# Patient Record
Sex: Female | Born: 1975
Health system: Southern US, Community
[De-identification: ages and names within clinical notes are randomized; demographics above are authoritative.]

## PROBLEM LIST (undated history)

## (undated) HISTORY — PX: TUBAL LIGATION: SHX77

---

## 1998-06-28 ENCOUNTER — Emergency Department (HOSPITAL_COMMUNITY): Admission: EM | Admit: 1998-06-28 | Discharge: 1998-06-28 | Payer: Self-pay | Admitting: Emergency Medicine

## 1998-06-28 ENCOUNTER — Encounter: Payer: Self-pay | Admitting: *Deleted

## 1999-11-05 ENCOUNTER — Other Ambulatory Visit: Admission: RE | Admit: 1999-11-05 | Discharge: 1999-11-05 | Payer: Self-pay | Admitting: *Deleted

## 2000-10-28 ENCOUNTER — Other Ambulatory Visit: Admission: RE | Admit: 2000-10-28 | Discharge: 2000-10-28 | Payer: Self-pay | Admitting: Internal Medicine

## 2001-12-08 ENCOUNTER — Other Ambulatory Visit: Admission: RE | Admit: 2001-12-08 | Discharge: 2001-12-08 | Payer: Self-pay | Admitting: Family Medicine

## 2002-12-21 ENCOUNTER — Other Ambulatory Visit: Admission: RE | Admit: 2002-12-21 | Discharge: 2002-12-21 | Payer: Self-pay | Admitting: Family Medicine

## 2013-01-15 ENCOUNTER — Ambulatory Visit (INDEPENDENT_AMBULATORY_CARE_PROVIDER_SITE_OTHER): Payer: BC Managed Care – PPO | Admitting: Emergency Medicine

## 2013-01-15 VITALS — BP 112/74 | HR 60 | Temp 98.9°F | Resp 16 | Ht 63.0 in | Wt 130.8 lb

## 2013-01-15 DIAGNOSIS — L500 Allergic urticaria: Secondary | ICD-10-CM

## 2013-01-15 MED ORDER — CYPROHEPTADINE HCL 4 MG PO TABS: 4.0000 mg | ORAL_TABLET | Freq: Four times a day (QID) | ORAL | Status: DC | PRN

## 2013-01-15 MED ORDER — OMEPRAZOLE 40 MG PO CPDR: 40.0000 mg | DELAYED_RELEASE_CAPSULE | Freq: Every day | ORAL | Status: DC

## 2013-01-15 MED ORDER — DIPHENHYDRAMINE HCL 25 MG PO CAPS: 25.0000 mg | ORAL_CAPSULE | Freq: Four times a day (QID) | ORAL | Status: DC | PRN

## 2013-01-15 MED ORDER — METHYLPREDNISOLONE ACETATE 80 MG/ML IJ SUSP
120.0000 mg | Freq: Once | INTRAMUSCULAR | Status: AC
  Administered 2013-01-15: 120 mg via INTRAMUSCULAR

## 2013-01-15 NOTE — Patient Instructions (Addendum)

## 2013-01-15 NOTE — Progress Notes (Signed)
[facility] [address] [place] [address] [phone]  Date:  01/15/2013   Name:  Kathy Rose   DOB:  24-Feb-1976   MRN:  [id number]  PCP:  No primary provider on file.    Chief Complaint: Rash   History of Present Illness:  Kathy Rose is a 37 y.o. very pleasant female patient who presents with the following:  No history of new allergen exposure or antecedent illness.  No history of new personal hygiene products.  Has generalized hives started Thursday.  Thought was a "bug bite" and the rash has spread.  No pulmonary symptoms.  No wheezing or cough or shortness of breath.  No nausea or vomiting.  No improvement with over the counter medications or other home remedies. Denies other complaint or health concern today.   There are no active problems to display for this patient.   History reviewed. No pertinent past medical history.  History reviewed. No pertinent past surgical history.  History  Substance Use Topics  . Smoking status: Never Smoker   . Smokeless tobacco: Not on file  . Alcohol Use: Not on file    History reviewed. No pertinent family history.  No Known Allergies  Medication list has been reviewed and updated.  No current outpatient prescriptions on file prior to visit.   No current facility-administered medications on file prior to visit.    Review of Systems:  As per HPI, otherwise negative.    Physical Examination: Filed Vitals:   01/15/13 1836  BP: 112/74  Pulse: 60  Temp: 98.9 F (37.2 C)  Resp: 16   Filed Vitals:   01/15/13 1836  Height: 5\' 3"  (1.6 m)  Weight: 130 lb 12.8 oz (59.33 kg)   Body mass index is 23.18 kg/(m^2). Ideal Body Weight: Weight in (lb) to have BMI = 25: 140.8  GEN: WDWN, NAD, Non-toxic, A & O x 3 HEENT: Atraumatic, Normocephalic. Neck supple. No masses, No LAD. Ears and Nose: No external deformity. CV: RRR, No M/G/R. No JVD. No thrill. No extra heart sounds. PULM: CTA B, no  wheezes, crackles, rhonchi. No retractions. No resp. distress. No accessory muscle use. ABD: S, NT, ND, +BS. No rebound. No HSM. EXTR: No c/c/e NEURO Normal gait.  PSYCH: Normally interactive. Conversant. Not depressed or anxious appearing.  Calm demeanor.  SKIN:  Hives on arms and neck and chest  Assessment and Plan: Hives Benadryl Periactin Depo medrol 120 prilosec   Signed,  Phillips Odor, MD

## 2013-01-29 ENCOUNTER — Telehealth: Payer: Self-pay

## 2013-01-29 DIAGNOSIS — L500 Allergic urticaria: Secondary | ICD-10-CM

## 2013-01-29 NOTE — Telephone Encounter (Signed)
Not until we see that she doesn't improve in a week.  Have her check back in a week.

## 2013-01-29 NOTE — Telephone Encounter (Signed)
Would you like to refer her to Dermatology?

## 2013-01-29 NOTE — Telephone Encounter (Signed)
Pt is wanting to talk with somone about getting a referral to a dermatologist   Best number [phone]

## 2013-02-04 NOTE — Telephone Encounter (Signed)
Left message to return call. Spoke to Dr. Dareen Piano to clarify and since she was seen 01/15/13 we can put in referral. Need to let patient know referral is placed

## 2013-06-15 ENCOUNTER — Ambulatory Visit (INDEPENDENT_AMBULATORY_CARE_PROVIDER_SITE_OTHER): Payer: BC Managed Care – PPO | Admitting: Family Medicine

## 2013-06-15 VITALS — BP 100/60 | HR 67 | Temp 98.0°F | Resp 18 | Ht 64.0 in | Wt 134.0 lb

## 2013-06-15 DIAGNOSIS — J208 Acute bronchitis due to other specified organisms: Secondary | ICD-10-CM

## 2013-06-15 DIAGNOSIS — J069 Acute upper respiratory infection, unspecified: Secondary | ICD-10-CM

## 2013-06-15 DIAGNOSIS — J209 Acute bronchitis, unspecified: Secondary | ICD-10-CM

## 2013-06-15 MED ORDER — ALBUTEROL SULFATE HFA 108 (90 BASE) MCG/ACT IN AERS: 2.0000 | INHALATION_SPRAY | RESPIRATORY_TRACT | Status: DC | PRN

## 2013-06-15 MED ORDER — BENZONATATE 100 MG PO CAPS: 100.0000 mg | ORAL_CAPSULE | Freq: Three times a day (TID) | ORAL | Status: DC | PRN

## 2013-06-15 NOTE — Progress Notes (Signed)
Subjective: Patient has been sick since Monday with a respiratory tract infection. She's been coughing. Last night she had some chills. She has not documented any fever. She's not coughing up a lot or blowing much from her head. She does not smoke. She has continued to work all week, though probably annoying her coworkers with a persistent cough. She does get a moderate number of respiratory tract infections. Her spouse and child have been sick. She has 2 children 7 and 1. 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  Objective: Pleasant alert lady with a fairly persistent cough. She looks like she doesn't feel well. Her TMs are normal. Throat has mild erythema down the midline where it looks like she's had postnasal drainage, but tonsillar tissue looks small and not inflamed. Her neck was supple without significant nodes. Chest is clear to auscultation but deep expiration triggers a lot of coughing.  Assessment: URI and bronchitis, viral, with some bronchospasm  Plan:  Albuterol inhaler Tessalon If the cough gets to where it's bothering her a lot at night we can get her a prescription for Hycodan, but I will not give that at this time since she is resting okay.

## 2013-06-15 NOTE — Patient Instructions (Signed)
Medication as ordered  Drink plenty of fluids  Get sufficient rest  Return or contact me if you begin bringing up a lot of purulent phlegm or if you start running a significant fever or get short or breath

## 2013-06-18 ENCOUNTER — Telehealth: Payer: Self-pay

## 2013-06-18 NOTE — Telephone Encounter (Addendum)
She used it for 2 doses. She states it made her tongue feel like it felt when she had a peanut allergy. I am concerned about her trying this again. Called her, to advise not to use this medication, and there is no alternative. Left message for her to call me back.

## 2013-06-18 NOTE — Telephone Encounter (Signed)
Pt is calling again in regards to getting something in place of albuteral due to her rxn to the medication. Best# [phone]

## 2013-06-18 NOTE — Telephone Encounter (Signed)
PT STATES THE ALBUTEROL SHE GIVEN ISN'T AGREEING WITH HER NEED TO HAVE SOMETHING ELSE CALLED IN. PLEASE CALL PT AT [phone]    [facility]

## 2013-06-18 NOTE — Telephone Encounter (Signed)
Unfortunately there is not an alternative to albuterol.  Is she having symptoms of an allergy like swelling of tongue/lips/throat, difficulty breathing, hives, etc?  If so she should discontinue right away.    Is the albuterol helping her respiratory symptoms?  If so (and symptoms are not concerning for hypersensitivity), getting a spacer from the pharmacy may be beneficial as this helps with the timing of inhalation.  Possible that technique may have contributed to medication being deposited into her mouth rather than inhaled in the lungs, causing irritation.  If the medication is not providing significant relief of her symptoms, ok to discontinue.

## 2013-06-18 NOTE — Telephone Encounter (Signed)
Patient states the medication made her tongue itch, she states she is better now. She currently has no problems, she is asking if there is an alternative to the albuterol.

## 2013-06-18 NOTE — Telephone Encounter (Signed)
lmom to cb. 

## 2013-06-19 NOTE — Telephone Encounter (Signed)
Left message for her to call me back so I can advise.  

## 2013-06-21 ENCOUNTER — Ambulatory Visit (INDEPENDENT_AMBULATORY_CARE_PROVIDER_SITE_OTHER): Payer: BC Managed Care – PPO | Admitting: Family Medicine

## 2013-06-21 ENCOUNTER — Telehealth: Payer: Self-pay

## 2013-06-21 VITALS — BP 110/68 | HR 74 | Temp 97.9°F | Resp 16 | Ht 64.0 in | Wt 134.4 lb

## 2013-06-21 DIAGNOSIS — J9801 Acute bronchospasm: Secondary | ICD-10-CM

## 2013-06-21 DIAGNOSIS — J209 Acute bronchitis, unspecified: Secondary | ICD-10-CM

## 2013-06-21 MED ORDER — PREDNISONE 20 MG PO TABS: ORAL_TABLET | ORAL | Status: DC

## 2013-06-21 MED ORDER — HYDROCOD POLST-CHLORPHEN POLST 10-8 MG/5ML PO LQCR: 5.0000 mL | Freq: Two times a day (BID) | ORAL | Status: DC | PRN

## 2013-06-21 MED ORDER — AZITHROMYCIN 250 MG PO TABS: ORAL_TABLET | ORAL | Status: DC

## 2013-06-21 NOTE — Telephone Encounter (Signed)
Please advise, she could not use the Albuterol, she is still coughing would like something else besides the tessalon

## 2013-06-21 NOTE — Progress Notes (Addendum)
Subjective:    Patient ID: Kathy Rose, female    DOB: February 05, 1976, 37 y.o.   MRN: [id number]  This chart was scribed for Kathy Chick, MD by Blanchard Kelch, ED Scribe. The patient was seen in room 12. Patient's care was started at 6:13 PM.   Cough Associated symptoms include postnasal drip, rhinorrhea, a sore throat and wheezing. Pertinent negatives include no chills, ear pain, fever or shortness of breath.    Kathy Rose is a 37 y.o. female who presents to office for a recheck from a week ago for a cough. She was seen by Dr. Alwyn Ren, who prescribed [facility] and an Albuterol inhaler. The cough began two days before being seen here.   She states that she was gradually improving, but when she goes to sleep at night she is waking her children up. She has not gotten a full night of sleep in the past week. Last week she was able to sleep through the night, so this is a new development. The cough seems to subside mildly during the day but always flares up at night. She has noticed mild wheezing at night as well. She had chills only once, a week ago. She has had a mild sore throat that she attributes to the coughing. She also complains of rhinorrhea with yellowish discharge and postnasal drip at night when she is laying down.  She tried eating raw onion for the cough, as well as honey and cinnamon and green tea as home remedies. She also tried the prescribed Tessalon Perles without relief. She believes she may be allergic to the albuterol inhaler because it caused her mouth to feel strange after using it once, so she stopped using it. She denies fever, ear pain, congestion, shortness of breath or sneezing.  She has daughters and states that one of them had similar symptoms that subsided quickly. She denies a history of asthma or seasonal allergies. She is not a smoker. She works at [employer] for [employer].   History reviewed. No pertinent past medical history. History reviewed. No pertinent  past surgical history. History reviewed. No pertinent family history. History   Social History  . Marital Status: Unknown    Spouse Name: N/A    Number of Children: N/A  . Years of Education: N/A   Occupational History  . Not on file.   Social History Main Topics  . Smoking status: Never Smoker   . Smokeless tobacco: Not on file  . Alcohol Use: Not on file  . Drug Use: Not on file  . Sexual Activity: Not on file   Other Topics Concern  . Not on file   Social History Narrative  . No narrative on file   Allergies  Allergen Reactions  . Albuterol     Patient indicates this made her tongue feel "funny"  . Claritin [Loratadine]    Current Outpatient Prescriptions on File Prior to Visit  Medication Sig Dispense Refill  . benzonatate (TESSALON) 100 MG capsule Take 1-2 capsules (100-200 mg total) by mouth 3 (three) times daily as needed for cough.  30 capsule  0  . cyproheptadine (PERIACTIN) 4 MG tablet Take 1 tablet (4 mg total) by mouth 4 (four) times daily as needed.  40 tablet  0  . omeprazole (PRILOSEC) 40 MG capsule Take 1 capsule (40 mg total) by mouth daily.  30 capsule  3   No current facility-administered medications on file prior to visit.     Review of Systems  Constitutional:  Negative for fever and chills.  HENT: Positive for postnasal drip, rhinorrhea and sore throat. Negative for congestion, ear pain and sneezing.   Respiratory: Positive for cough and wheezing. Negative for shortness of breath.        Objective:   Physical Exam  Nursing note and vitals reviewed. Constitutional: She is oriented to person, place, and time. She appears well-developed and well-nourished. No distress.  HENT:  Head: Normocephalic and atraumatic.  Right Ear: Tympanic membrane, external ear and ear canal normal.  Left Ear: Tympanic membrane, external ear and ear canal normal.  Mouth/Throat: Oropharynx is clear and moist and mucous membranes are normal.  Eyes: EOM are normal.   Neck: Normal range of motion. Neck supple. No tracheal deviation present.  Cardiovascular: Normal rate and regular rhythm.   Pulmonary/Chest: Effort normal. No respiratory distress. She has wheezes.  Scant expiratory wheezing in the right lung field  Musculoskeletal: Normal range of motion.  Lymphadenopathy:    She has no cervical adenopathy.  Neurological: She is alert and oriented to person, place, and time.  Skin: Skin is warm and dry.  Psychiatric: She has a normal mood and affect. Her behavior is normal.          Assessment & Plan:  Acute bronchitis  Bronchospasm   Meds ordered this encounter  Medications  . chlorpheniramine-HYDROcodone (TUSSIONEX) 10-8 MG/5ML LQCR    Sig: Take 5 mLs by mouth every 12 (twelve) hours as needed for cough.    Dispense:  240 mL    Refill:  0  . azithromycin (ZITHROMAX) 250 MG tablet    Sig: Take 2 tabs PO x 1 dose, then 1 tab PO QD x 4 days    Dispense:  6 tablet    Refill:  0  . predniSONE (DELTASONE) 20 MG tablet    Sig: Two tablets daily x 5 days then one tablet daily x 5 days    Dispense:  15 tablet    Refill:  0    I personally performed the services described in this documentation, which was scribed in my presence.  The recorded information has been reviewed and is accurate.   Nilda Simmer, M.D.  [facility] [address] [place], [place]  [address] [phone] phone [phone] fax

## 2013-06-21 NOTE — Telephone Encounter (Signed)
Noted.  I see that Dr. Katrinka Blazing saw her.

## 2013-06-21 NOTE — Patient Instructions (Signed)

## 2013-06-21 NOTE — Telephone Encounter (Signed)
She finally called back, she is requesting something else for cough, message sent to Dr Alwyn Ren, she knows not to use the albuterol, there is no alternative.

## 2013-06-21 NOTE — Telephone Encounter (Signed)
Pt is calling because she is needing something stronger than the tesalon pills for the cough Call back number is [phone]

## 2013-08-03 DIAGNOSIS — N920 Excessive and frequent menstruation with regular cycle: Secondary | ICD-10-CM | POA: Insufficient documentation

## 2014-08-14 ENCOUNTER — Other Ambulatory Visit: Payer: Self-pay | Admitting: Family Medicine

## 2014-08-14 ENCOUNTER — Other Ambulatory Visit (HOSPITAL_COMMUNITY)
Admission: RE | Admit: 2014-08-14 | Discharge: 2014-08-14 | Disposition: A | Discharge status: Home / Self Care | Payer: BC Managed Care – PPO | Source: Ambulatory Visit | Attending: Family Medicine | Admitting: Family Medicine

## 2014-08-14 DIAGNOSIS — Z124 Encounter for screening for malignant neoplasm of cervix: Secondary | ICD-10-CM | POA: Diagnosis not present

## 2014-08-19 LAB — CYTOLOGY - PAP

## 2016-05-07 ENCOUNTER — Encounter (HOSPITAL_COMMUNITY): Payer: Self-pay | Admitting: *Deleted

## 2016-05-07 ENCOUNTER — Ambulatory Visit (HOSPITAL_COMMUNITY)
Admission: EM | Admit: 2016-05-07 | Discharge: 2016-05-07 | Disposition: A | Discharge status: Home / Self Care | Payer: BC Managed Care – PPO | Attending: Internal Medicine | Admitting: Internal Medicine

## 2016-05-07 DIAGNOSIS — M62830 Muscle spasm of back: Secondary | ICD-10-CM

## 2016-05-07 DIAGNOSIS — S39012A Strain of muscle, fascia and tendon of lower back, initial encounter: Secondary | ICD-10-CM

## 2016-05-07 MED ORDER — CYCLOBENZAPRINE HCL 10 MG PO TABS: ORAL_TABLET | ORAL | 0 refills | Status: DC

## 2016-05-07 MED ORDER — MELOXICAM 7.5 MG PO TABS: ORAL_TABLET | ORAL | 1 refills | Status: DC

## 2016-05-07 MED FILL — MELOXICAM 7.5 MG TABLET: 7.5 | 30 days supply | Qty: 30 | Fill #0

## 2016-05-07 MED FILL — CYCLOBENZAPRINE 10 MG TAB: 10 | 7 days supply | Qty: 20 | Fill #0
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
## 2016-07-20 DIAGNOSIS — N921 Excessive and frequent menstruation with irregular cycle: Secondary | ICD-10-CM | POA: Diagnosis not present

## 2016-07-20 DIAGNOSIS — Z0000 Encounter for general adult medical examination without abnormal findings: Secondary | ICD-10-CM | POA: Diagnosis not present

## 2016-07-20 DIAGNOSIS — M7742 Metatarsalgia, left foot: Secondary | ICD-10-CM | POA: Diagnosis not present

## 2016-07-20 DIAGNOSIS — L509 Urticaria, unspecified: Secondary | ICD-10-CM | POA: Diagnosis not present

## 2016-07-21 ENCOUNTER — Other Ambulatory Visit: Payer: Self-pay | Admitting: Family Medicine

## 2016-07-21 DIAGNOSIS — N921 Excessive and frequent menstruation with irregular cycle: Secondary | ICD-10-CM

## 2016-08-03 ENCOUNTER — Other Ambulatory Visit: Payer: BC Managed Care – PPO

## 2016-08-24 ENCOUNTER — Ambulatory Visit
Admission: RE | Admit: 2016-08-24 | Discharge: 2016-08-24 | Disposition: A | Discharge status: Home / Self Care | Payer: 59 | Source: Ambulatory Visit | Attending: Family Medicine | Admitting: Family Medicine

## 2016-08-24 DIAGNOSIS — N921 Excessive and frequent menstruation with irregular cycle: Secondary | ICD-10-CM

## 2016-09-21 ENCOUNTER — Ambulatory Visit (HOSPITAL_COMMUNITY)
Admission: EM | Admit: 2016-09-21 | Discharge: 2016-09-21 | Disposition: A | Discharge status: Home / Self Care | Payer: 59 | Attending: Family Medicine | Admitting: Family Medicine

## 2016-09-21 DIAGNOSIS — R05 Cough: Secondary | ICD-10-CM

## 2016-09-21 DIAGNOSIS — J40 Bronchitis, not specified as acute or chronic: Secondary | ICD-10-CM

## 2016-09-21 DIAGNOSIS — J00 Acute nasopharyngitis [common cold]: Secondary | ICD-10-CM | POA: Diagnosis not present

## 2016-09-21 DIAGNOSIS — R059 Cough, unspecified: Secondary | ICD-10-CM

## 2016-09-21 MED ORDER — AZITHROMYCIN 250 MG PO TABS: 250.0000 mg | ORAL_TABLET | Freq: Every day | ORAL | 0 refills | Status: DC

## 2016-09-21 MED ORDER — IPRATROPIUM BROMIDE 0.06 % NA SOLN: 2.0000 | Freq: Four times a day (QID) | NASAL | 0 refills | Status: DC

## 2016-09-21 MED ORDER — BENZONATATE 100 MG PO CAPS: 100.0000 mg | ORAL_CAPSULE | Freq: Three times a day (TID) | ORAL | 0 refills | Status: DC

## 2016-09-21 NOTE — ED Triage Notes (Signed)
C/o cough and congestion for two weeks States she was feeling like this in the past and it went away  States cough is not productive  otc meds used as tx  No distress at this time

## 2016-09-21 NOTE — ED Provider Notes (Signed)
CSN: [id number]     Arrival date & time 09/21/16  1007 History   First MD Initiated Contact with Patient 09/21/16 1057     Chief Complaint  Patient presents with  . Cough  . Nasal Congestion   (Consider location/radiation/quality/duration/timing/severity/associated sxs/prior Treatment) Patient c/o cough congestion and uri sx's for over 2 weeks.   The history is provided by the patient.  Cough  Cough characteristics:  Productive Sputum characteristics:  White Severity:  Moderate Onset quality:  Sudden Duration:  2 weeks Timing:  Constant Progression:  Unchanged Chronicity:  New Smoker: no   Context: upper respiratory infection   Relieved by:  Nothing Worsened by:  Nothing Ineffective treatments:  None tried Associated symptoms: rhinorrhea     No past medical history on file. No past surgical history on file. No family history on file. Social History  Substance Use Topics  . Smoking status: Never Smoker  . Smokeless tobacco: Never Used  . Alcohol use No   OB History    No data available     Review of Systems  Constitutional: Negative.   HENT: Positive for rhinorrhea.   Eyes: Negative.   Respiratory: Positive for cough.   Cardiovascular: Negative.   Gastrointestinal: Negative.   Endocrine: Negative.   Genitourinary: Negative.   Musculoskeletal: Negative.   Skin: Negative.   Allergic/Immunologic: Negative.   Hematological: Negative.   Psychiatric/Behavioral: Negative.     Allergies  Albuterol and Claritin [loratadine]  Home Medications   Prior to Admission medications   Medication Sig Start Date End Date Taking? Authorizing Provider  azithromycin (ZITHROMAX) 250 MG tablet Take 2 tabs PO x 1 dose, then 1 tab PO QD x 4 days 06/21/13   Ethelda Chick, MD  azithromycin (ZITHROMAX) 250 MG tablet Take 1 tablet (250 mg total) by mouth daily. Take first 2 tablets together, then 1 every day until finished. 09/21/16   Deatra Canter, FNP  benzonatate (TESSALON) 100  MG capsule Take 1-2 capsules (100-200 mg total) by mouth 3 (three) times daily as needed for cough. 06/15/13   Peyton Najjar, MD  benzonatate (TESSALON) 100 MG capsule Take 1 capsule (100 mg total) by mouth every 8 (eight) hours. 09/21/16   Deatra Canter, FNP  chlorpheniramine-HYDROcodone (TUSSIONEX) 10-8 MG/5ML LQCR Take 5 mLs by mouth every 12 (twelve) hours as needed for cough. 06/21/13   Ethelda Chick, MD  cyclobenzaprine (FLEXERIL) 10 MG tablet 1/2 to 1 tablet every 8 hours as needed for muscle spasm 05/07/16   Riki Sheer, PA-C  cyproheptadine (PERIACTIN) 4 MG tablet Take 1 tablet (4 mg total) by mouth 4 (four) times daily as needed. 01/15/13   Carmelina Dane, MD  ipratropium (ATROVENT) 0.06 % nasal spray Place 2 sprays into both nostrils 4 (four) times daily. 09/21/16   Deatra Canter, FNP  meloxicam (MOBIC) 7.5 MG tablet 1 or 2 a day for back pain and inflammation. 05/07/16   Riki Sheer, PA-C  omeprazole (PRILOSEC) 40 MG capsule Take 1 capsule (40 mg total) by mouth daily. 01/15/13   Carmelina Dane, MD  predniSONE (DELTASONE) 20 MG tablet Two tablets daily x 5 days then one tablet daily x 5 days 06/21/13   Ethelda Chick, MD   Meds Ordered and Administered this Visit  Medications - No data to display  BP 113/69 (BP Location: Left Arm)   Pulse 79   Temp 99 F (37.2 C) (Oral)   Resp 16   SpO2  95%  No data found.   Physical Exam  Constitutional: She appears well-developed and well-nourished.  HENT:  Head: Normocephalic and atraumatic.  Right Ear: External ear normal.  Left Ear: External ear normal.  Mouth/Throat: Oropharynx is clear and moist.  Eyes: Conjunctivae and EOM are normal. Pupils are equal, round, and reactive to light.  Neck: Normal range of motion. Neck supple.  Cardiovascular: Normal rate, regular rhythm and normal heart sounds.   Pulmonary/Chest: Effort normal and breath sounds normal.  Abdominal: Soft. Bowel sounds are normal.  Nursing note and  vitals reviewed.   Urgent Care Course     Procedures (including critical care time)  Labs Review Labs Reviewed - No data to display  Imaging Review No results found.   Visual Acuity Review  Right Eye Distance:   Left Eye Distance:   Bilateral Distance:    Right Eye Near:   Left Eye Near:    Bilateral Near:         MDM   1. Acute nasopharyngitis   2. Bronchitis   3. Cough    zpak Tessalon Ipratropium nasal spray   Push po fluids, rest, tylenol and motrin otc prn as directed for fever, arthralgias, and myalgias.  Follow up prn if sx's continue or persist.    Deatra CanterWilliam J Oxford, FNP 09/21/16 1106

## 2016-10-04 ENCOUNTER — Other Ambulatory Visit: Payer: Self-pay | Admitting: Family Medicine

## 2016-10-04 DIAGNOSIS — N83201 Unspecified ovarian cyst, right side: Secondary | ICD-10-CM

## 2016-10-07 ENCOUNTER — Ambulatory Visit
Admission: RE | Admit: 2016-10-07 | Discharge: 2016-10-07 | Disposition: A | Discharge status: Home / Self Care | Payer: 59 | Source: Ambulatory Visit | Attending: Family Medicine | Admitting: Family Medicine

## 2016-10-07 DIAGNOSIS — N83201 Unspecified ovarian cyst, right side: Secondary | ICD-10-CM

## 2017-02-16 IMAGING — US US TRANSVAGINAL NON-OB
1 series · 13 of 25 positions shown · non-contrast
Comparison: No recent.

CLINICAL DATA: Intermittent spotting.

EXAM:
TRANSABDOMINAL AND TRANSVAGINAL ULTRASOUND OF PELVIS
TECHNIQUE: Both transabdominal and transvaginal ultrasound examinations of the
pelvis were performed. Transabdominal technique was performed for
global imaging of the pelvis including uterus, ovaries, adnexal
regions, and pelvic cul-de-sac. It was necessary to proceed with
endovaginal exam following the transabdominal exam to visualize the
uterus and ovaries.

[Series 1: us transvaginal non-ob · 0.22mm/px · 13 of 71 slices shown]
[im 1/71]
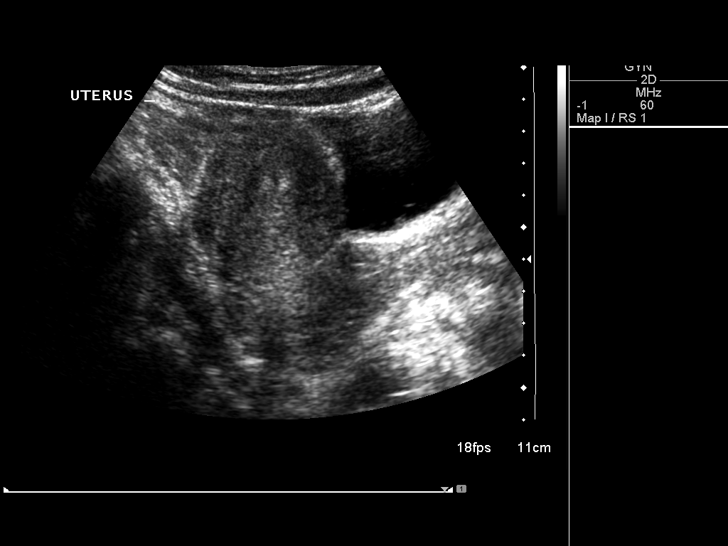
[im 6/71]
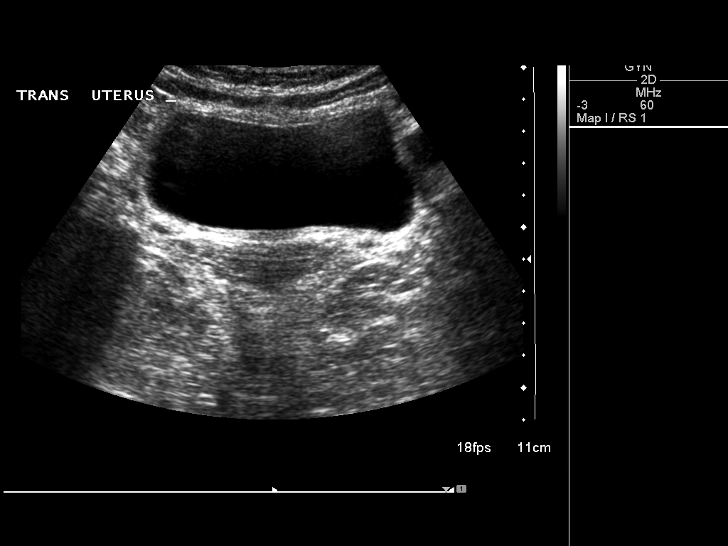
[im 12/71]
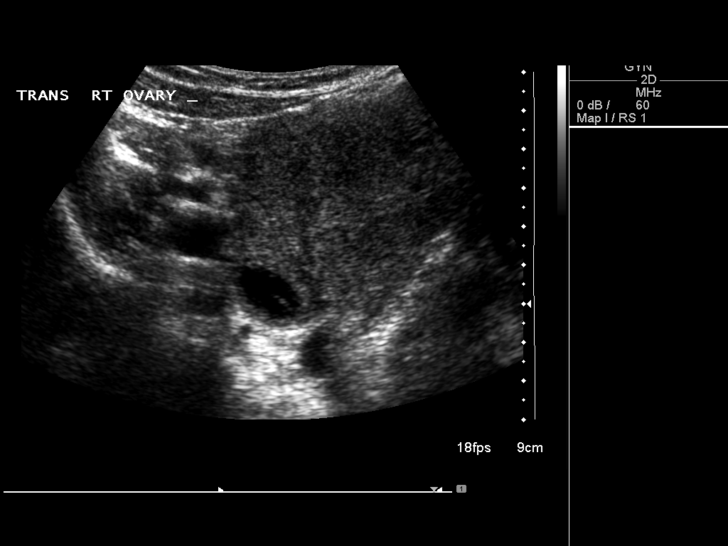
[im 18/71]
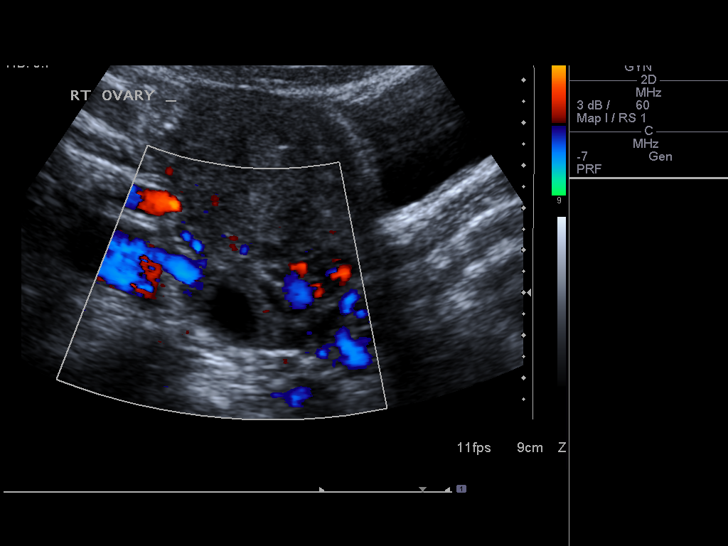
[im 24/71]
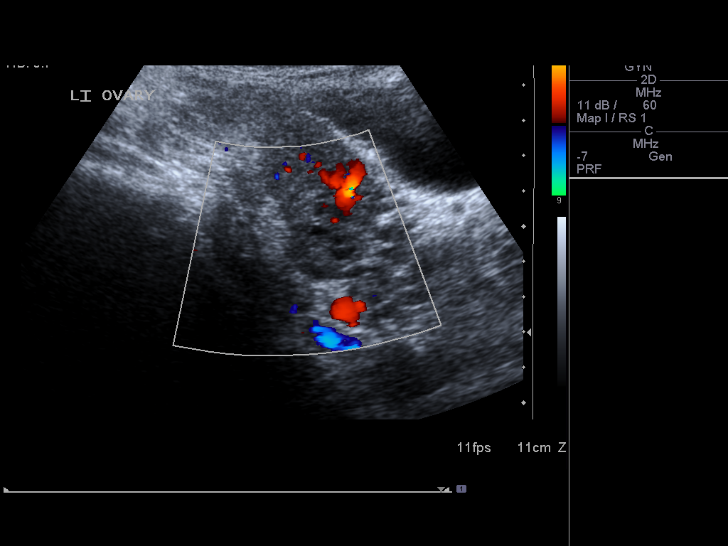
[im 30/71]
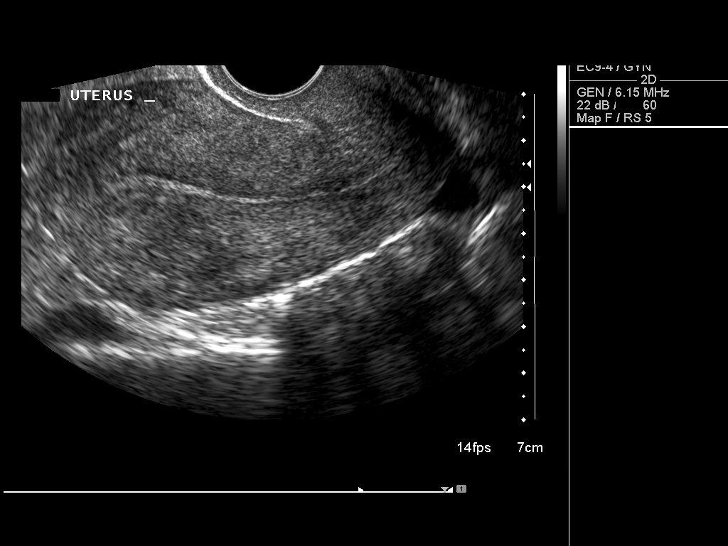
[im 36/71]
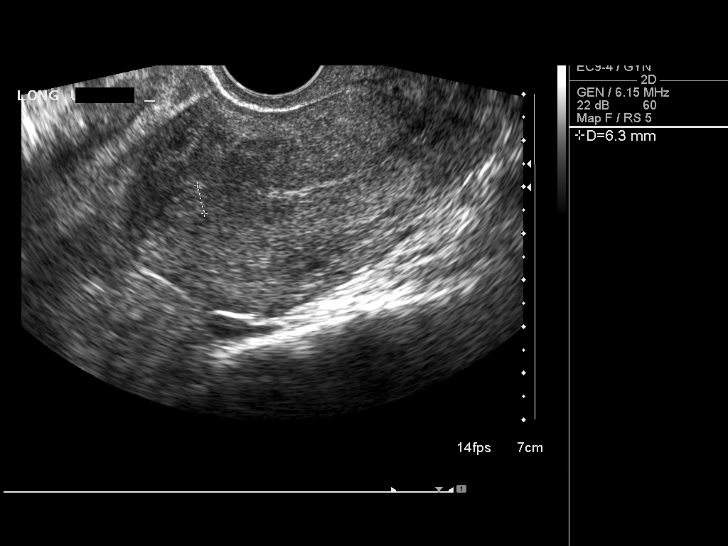
[im 41/71]
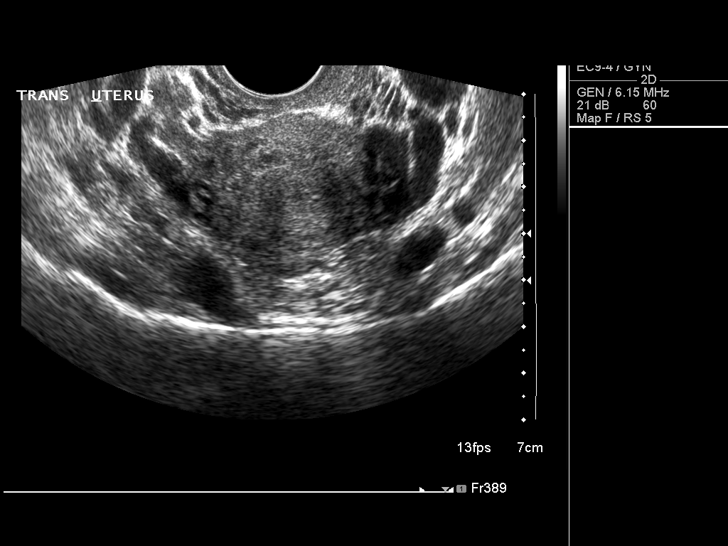
[im 47/71]
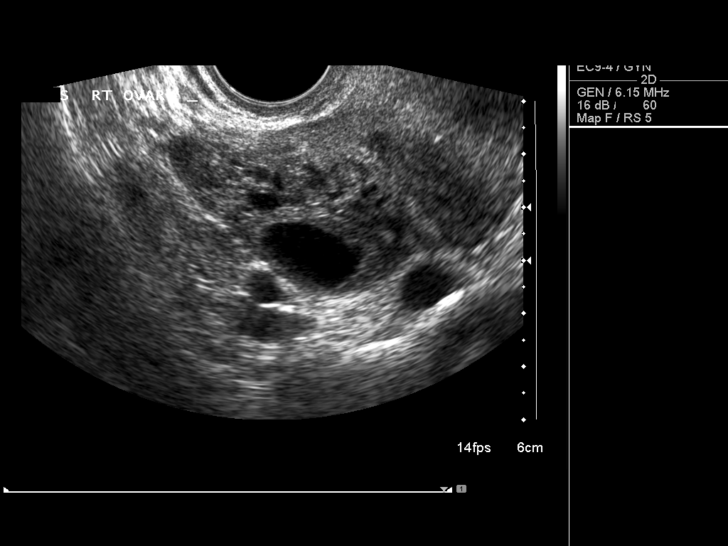
[im 53/71]
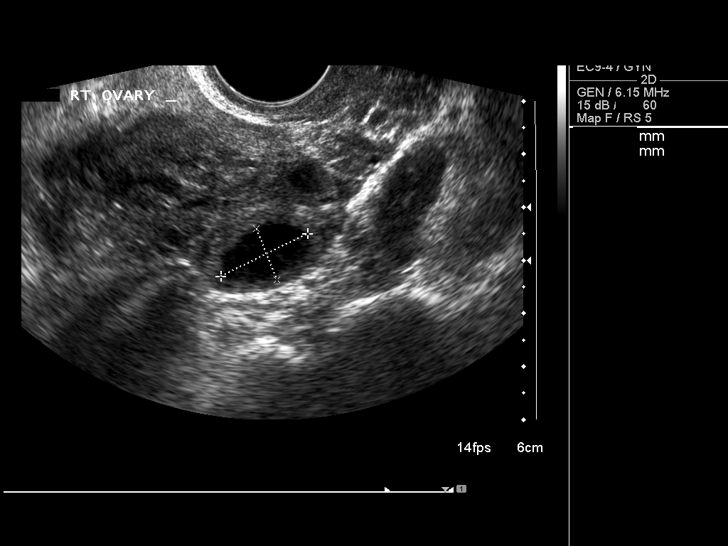
[im 59/71]
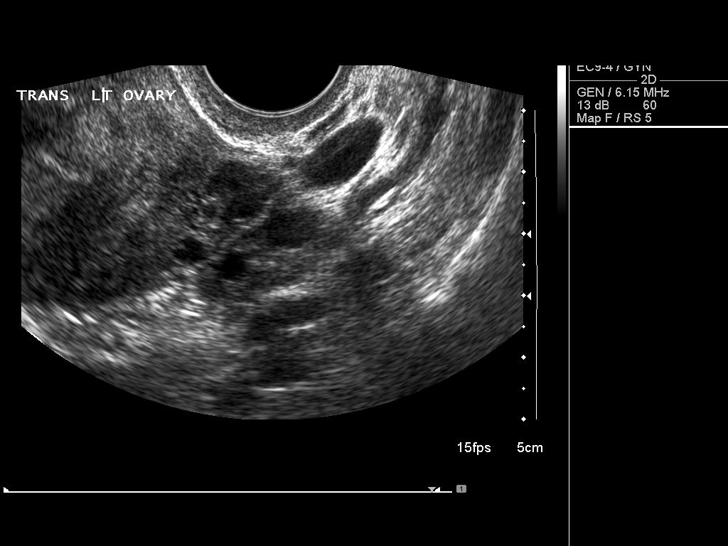
[im 65/71]
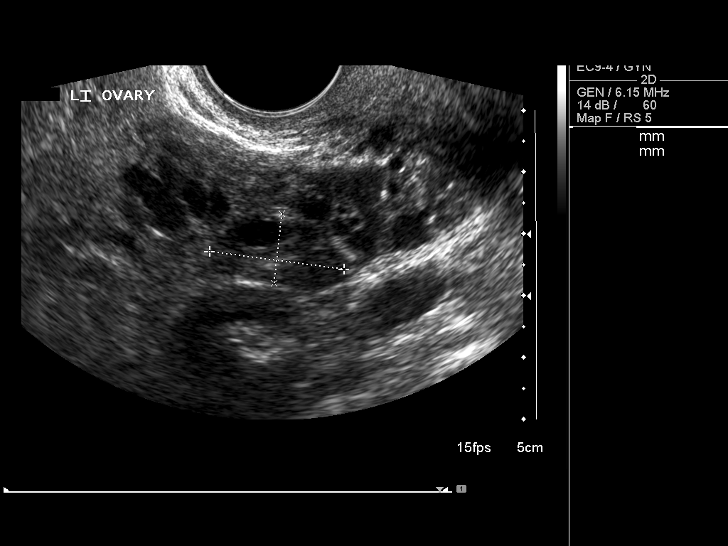
[im 71/71]
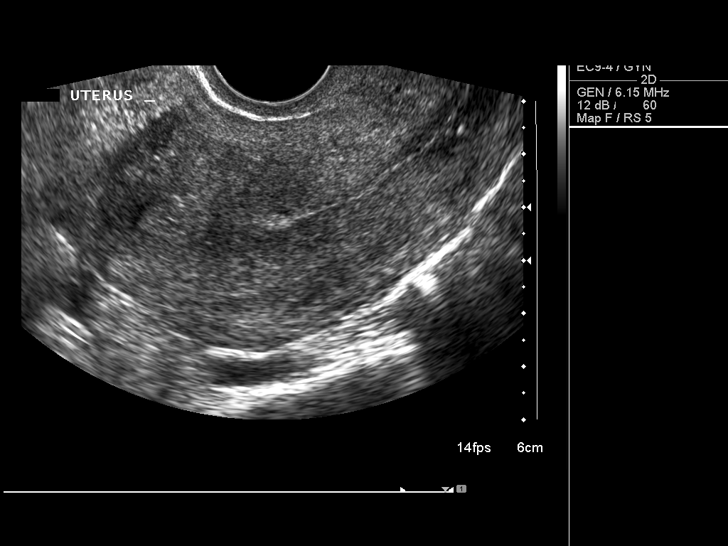

[13 of 25 positions shown; findings below may reference images not displayed]

FINDINGS: Uterus

Measurements: 8.5 x 4.8 x 6.0 cm. No fibroids or other mass
visualized.

Endometrium

Thickness: 6.3 mm.  No focal abnormality visualized.

Right ovary

Measurements: 3.0 x 1.5 x 3.0 cm. Normal appearance/no adnexal mass.

Left ovary

Measurements: 1.8 x 1.0 x 1.9 cm. Slightly irregular complex cyst,
most likely collapsing functional cyst or small hemorrhagic cyst.

Other findings

Trace free pelvic fluid .
IMPRESSION: 1.9 cm small irregular complex cyst right ovary, most likely
collapsing functional cyst or small hemorrhagic cyst. Trace free
pelvic fluid. Short-interval follow up ultrasound in 6-12 weeks is
recommended, preferably during the week following the patient's
normal menses.

## 2017-06-27 ENCOUNTER — Ambulatory Visit (HOSPITAL_COMMUNITY)
Admission: EM | Admit: 2017-06-27 | Discharge: 2017-06-27 | Disposition: A | Discharge status: Home / Self Care | Payer: 59 | Attending: Emergency Medicine | Admitting: Emergency Medicine

## 2017-06-27 ENCOUNTER — Other Ambulatory Visit: Payer: Self-pay

## 2017-06-27 ENCOUNTER — Encounter (HOSPITAL_COMMUNITY): Payer: Self-pay | Admitting: Emergency Medicine

## 2017-06-27 DIAGNOSIS — J019 Acute sinusitis, unspecified: Secondary | ICD-10-CM

## 2017-06-27 DIAGNOSIS — B001 Herpesviral vesicular dermatitis: Secondary | ICD-10-CM

## 2017-06-27 MED ORDER — AMOXICILLIN-POT CLAVULANATE 875-125 MG PO TABS: 1.0000 | ORAL_TABLET | Freq: Two times a day (BID) | ORAL | 0 refills | Status: AC

## 2017-06-27 MED ORDER — VALACYCLOVIR HCL 1 G PO TABS: 2000.0000 mg | ORAL_TABLET | Freq: Two times a day (BID) | ORAL | 0 refills | Status: AC

## 2017-06-27 MED ORDER — HYDROCOD POLST-CPM POLST ER 10-8 MG/5ML PO SUER: 5.0000 mL | Freq: Two times a day (BID) | ORAL | 0 refills | Status: AC | PRN

## 2017-06-27 MED FILL — AMOX-CLAV 875-125 MG TABLET: 875-125 | 7 days supply | Qty: 14 | Fill #0

## 2017-06-27 MED FILL — valACYclovir HCL 1 GM TABS: 1 | 1 days supply | Qty: 4 | Fill #0

## 2017-06-27 MED FILL — HYDROCODONE-CHLORPHEN ER SU: 10-8 | 12 days supply | Qty: 115 | Fill #0

## 2017-06-27 NOTE — ED Provider Notes (Signed)
[facility]    CSN: [id number] Arrival date & time: 06/27/17  1000     History   Chief Complaint Chief Complaint  Patient presents with  . Cough    HPI Kathy Rose is a 41 y.o. female.   PMH insignificant, presents today for 12 day duration of nasal congestion accompanied by running nose, headache and productive coughing. No CP, SOB or wheezing reports. She also denies sinus pain/pressure, abd pain, nausea, or vomiting. She reports to got better at one point but then immediately got sick again.   HPI  History reviewed. No pertinent past medical history.  There are no active problems to display for this patient.   Past Surgical History:  Procedure Laterality Date  . TUBAL LIGATION      OB History    No data available       Home Medications    Prior to Admission medications   Medication Sig Start Date End Date Taking? Authorizing Provider  amoxicillin-clavulanate (AUGMENTIN) 875-125 MG tablet Take 1 tablet 2 (two) times daily for 7 days by mouth. 06/27/17 07/04/17  Lucia EstelleZheng, Torien Ramroop, NP  chlorpheniramine-HYDROcodone (TUSSIONEX PENNKINETIC ER) 10-8 MG/5ML SUER Take 5 mLs every 12 (twelve) hours as needed for up to 5 days by mouth for cough. 06/27/17 07/02/17  Lucia EstelleZheng, Rhea Thrun, NP  valACYclovir (VALTREX) 1000 MG tablet Take 2 tablets (2,000 mg total) 2 (two) times daily for 1 day by mouth. 06/27/17 06/28/17  Lucia EstelleZheng, Story Vanvranken, NP    Family History No family history on file.  Social History Social History   Tobacco Use  . Smoking status: Never Smoker  . Smokeless tobacco: Never Used  Substance Use Topics  . Alcohol use: No  . Drug use: No     Allergies   Albuterol and Claritin [loratadine]   Review of Systems Review of Systems  Constitutional: Negative for chills, fatigue and fever.  HENT: Positive for congestion and rhinorrhea. Negative for ear pain, facial swelling, sinus pressure, sinus pain and sore throat.   Respiratory: Positive for cough.  Negative for shortness of breath and wheezing.   Cardiovascular: Negative for chest pain and palpitations.  Gastrointestinal: Negative for abdominal pain, nausea and vomiting.  Skin: Negative for rash.  Neurological: Positive for headaches. Negative for dizziness.     Physical Exam Triage Vital Signs ED Triage Vitals [06/27/17 1022]  Enc Vitals Group     BP 132/79     Pulse Rate 79     Resp 20     Temp 98.4 F (36.9 C)     Temp Source Oral     SpO2 98 %     Weight      Height      Head Circumference      Peak Flow      Pain Score 4     Pain Loc      Pain Edu?      Excl. in GC?    No data found.  Updated Vital Signs BP 132/79 (BP Location: Left Arm)   Pulse 79   Temp 98.4 F (36.9 C) (Oral)   Resp 20   LMP 05/27/2017   SpO2 98%   Visual Acuity Right Eye Distance:   Left Eye Distance:   Bilateral Distance:    Right Eye Near:   Left Eye Near:    Bilateral Near:     Physical Exam  Constitutional: She is oriented to person, place, and time. She appears well-developed and well-nourished.  HENT:  Head:  Normocephalic and atraumatic.  Right Ear: External ear normal.  Left Ear: External ear normal.  Nose: Nose normal.  Mouth/Throat: Oropharynx is clear and moist. No oropharyngeal exudate.  No sinus tenderness on palpation.   Eyes: Conjunctivae are normal. Pupils are equal, round, and reactive to light.  Neck: Normal range of motion. Neck supple.  Cardiovascular: Normal rate, regular rhythm and normal heart sounds.  No murmur heard. Pulmonary/Chest: Effort normal and breath sounds normal. She has no wheezes.  Abdominal: Soft. Bowel sounds are normal. There is no tenderness.  Lymphadenopathy:    She has no cervical adenopathy.  Neurological: She is alert and oriented to person, place, and time.  Skin: Skin is warm and dry.  Herpes labialis noted   Nursing note and vitals reviewed.    UC Treatments / Results  Labs (all labs ordered are listed, but only  abnormal results are displayed) Labs Reviewed - No data to display  EKG  EKG Interpretation None      Radiology No results found.  Procedures Procedures (including critical care time)  Medications Ordered in UC Medications - No data to display   Initial Impression / Assessment and Plan / UC Course  I have reviewed the triage vital signs and the nursing notes.  Pertinent labs & imaging results that were available during my care of the patient were reviewed by me and considered in my medical decision making (see chart for details).   Final Clinical Impressions(s) / UC Diagnoses   Final diagnoses:  Acute non-recurrent sinusitis, unspecified location  Cold sore    ED Discharge Orders        Ordered    amoxicillin-clavulanate (AUGMENTIN) 875-125 MG tablet  2 times daily     06/27/17 1040    chlorpheniramine-HYDROcodone (TUSSIONEX PENNKINETIC ER) 10-8 MG/5ML SUER  Every 12 hours PRN     06/27/17 1040    valACYclovir (VALTREX) 1000 MG tablet  2 times daily     06/27/17 1040     Prescriptions given( below). Reviewed directions for usage and side effects. Patient states understanding and will call with questions or problems. Patient instructed to call or follow up with his/her primary care doctor if failure to improve or change in symptoms. Discharge instruction given.  Controlled Substance Prescriptions [place] Controlled Substance Registry consulted? Not Applicable   Lucia EstelleZheng, Aracelia Brinson, NP 06/27/17 1044

## 2017-06-27 NOTE — ED Triage Notes (Signed)
Runny nose, cough and headache with coughing.  Over the weekend was coughing up yellow phlegm.  Onset 11/1.

## 2017-10-17 ENCOUNTER — Other Ambulatory Visit: Payer: Self-pay

## 2017-10-17 ENCOUNTER — Encounter: Payer: Self-pay | Admitting: Physician Assistant

## 2017-10-17 ENCOUNTER — Ambulatory Visit: Payer: BLUE CROSS/BLUE SHIELD | Admitting: Physician Assistant

## 2017-10-17 VITALS — BP 98/60 | HR 94 | Temp 98.8°F | Resp 18 | Ht 64.0 in | Wt 146.4 lb

## 2017-10-17 DIAGNOSIS — R82998 Other abnormal findings in urine: Secondary | ICD-10-CM

## 2017-10-17 DIAGNOSIS — M545 Low back pain: Secondary | ICD-10-CM | POA: Diagnosis not present

## 2017-10-17 DIAGNOSIS — R350 Frequency of micturition: Secondary | ICD-10-CM

## 2017-10-17 LAB — POCT URINALYSIS DIP (MANUAL ENTRY)
BILIRUBIN UA: NEGATIVE
GLUCOSE UA: NEGATIVE mg/dL
Ketones, POC UA: NEGATIVE mg/dL
Nitrite, UA: NEGATIVE
Protein Ur, POC: NEGATIVE mg/dL
Spec Grav, UA: 1.025 (ref 1.010–1.025)
UROBILINOGEN UA: 0.2 U/dL
pH, UA: 6.5 (ref 5.0–8.0)

## 2017-10-17 MED ORDER — NITROFURANTOIN MONOHYD MACRO 100 MG PO CAPS: 100.0000 mg | ORAL_CAPSULE | Freq: Two times a day (BID) | ORAL | 0 refills | Status: AC

## 2017-10-17 NOTE — Progress Notes (Signed)
10/18/2017 at 11:54 AM  Kathy Rose / DOB: 06/12/1976 / MRN: [id number]  The patient does not have a problem list on file.  SUBJECTIVE  Kathy Rose is a 42 y.o. female who complains of hematuria, suprapubic pressure, low back pain, dysuria, urinary frequency and urinary urgency x 5 days. She denies flank pain and abdominal pain. Has not tried anything for relief. Most recent UTI prior to this was 06/2017.  Pt is sexually active with monogamous partner.   She  has no past medical history on file.    Medications reviewed and updated by myself where necessary, and exist elsewhere in the encounter.   Kathy Rose is allergic to albuterol and claritin [loratadine]. She  reports that  has never smoked. she has never used smokeless tobacco. She reports that she does not drink alcohol or use drugs. She  has no sexual activity history on file. The patient  has a past surgical history that includes Tubal ligation.  Her family history is not on file.  Review of Systems  Constitutional: Negative for chills and fever.  Gastrointestinal: Negative for nausea and vomiting.  Neurological: Negative for tingling and focal weakness.    OBJECTIVE  Her  height is 5\' 4"  (1.626 m) and weight is 146 lb 6.4 oz (66.4 kg). Her oral temperature is 98.8 F (37.1 C). Her blood pressure is 98/60 and her pulse is 94. Her respiration is 18 and oxygen saturation is 99%.  The patient's body mass index is 25.13 kg/m.  Physical Exam  Constitutional: She is oriented to person, place, and time. She appears well-developed and well-nourished.  HENT:  Head: Normocephalic and atraumatic.  Eyes: Conjunctivae are normal.  Neck: Normal range of motion.  Pulmonary/Chest: Effort normal.  Abdominal: Soft. Normal appearance. There is no tenderness. There is no CVA tenderness.  Neurological: She is alert and oriented to person, place, and time.  Skin: Skin is warm and dry.  Psychiatric: She has a normal mood and  affect.  Vitals reviewed.   Results for orders placed or performed in visit on 10/17/17 (from the past 24 hour(s))  POCT urinalysis dipstick     Status: Abnormal   Collection Time: 10/17/17  5:00 PM  Result Value Ref Range   Color, UA yellow yellow   Clarity, UA clear clear   Glucose, UA negative negative mg/dL   Bilirubin, UA negative negative   Ketones, POC UA negative negative mg/dL   Spec Grav, UA 7.829 5.621 - 1.025   Blood, UA small (A) negative   pH, UA 6.5 5.0 - 8.0   Protein Ur, POC negative negative mg/dL   Urobilinogen, UA 0.2 0.2 or 1.0 E.U./dL   Nitrite, UA Negative Negative   Leukocytes, UA Large (3+) (A) Negative  Urinalysis, microscopic only     Status: Abnormal   Collection Time: 10/17/17  5:46 PM  Result Value Ref Range   WBC, UA >30 (A) 0 - 5 /hpf   RBC, UA 3-10 (A) 0 - 2 /hpf   Epithelial Cells (non renal) 0-10 0 - 10 /hpf   Casts None seen None seen /lpf   Crystals Present (A) N/A   Crystal Type Calcium Oxalate N/A   Mucus, UA Present Not Estab.   Bacteria, UA Few None seen/Few   Narrative   Performed at:  [address] [place], [place]  [id number] Lab Director: Jolene Schimke MD, Phone:  [phone]    ASSESSMENT & PLAN  Kathy Rose was seen  today for back pain and dysuria.  Diagnoses and all orders for this visit:  Urinary frequency -     POCT urinalysis dipstick -     Urinalysis, microscopic only  Leukocytes in urine -     Urine Culture -     nitrofurantoin, macrocrystal-monohydrate, (MACROBID) 100 MG capsule; Take 1 capsule (100 mg total) by mouth 2 (two) times daily for 5 days.  History, physical exam findings, and point-of-care urinalysis consistent with UTI.  Will treat empirically at this time for UTI with Macrobid.  Urine culture pending.The patient was advised to call or come back to clinic if she does not see an improvement in symptoms, or worsens with the above plan.   Benjiman CoreBrittany Bethanne Mule, PA-C  Primary Care at  [facility] 10/18/2017 11:54 AM

## 2017-10-17 NOTE — Patient Instructions (Addendum)
  Your results indicate you have a UTI. I have given you a prescription for an antibiotic. Please take with food. I have sent off a urine culture and we should have those results in 48 hours. If your symptoms worsen while you are awaiting these results or you develop fever, chills, flank pian, nausea and vomiting, please seek care immediately.    Urinary Tract Infection, Adult A urinary tract infection (UTI) is an infection of any part of the urinary tract. The urinary tract includes the:  Kidneys.  Ureters.  Bladder.  Urethra.  These organs make, store, and get rid of pee (urine) in the body. Follow these instructions at home:  Take over-the-counter and prescription medicines only as told by your doctor.  If you were prescribed an antibiotic medicine, take it as told by your doctor. Do not stop taking the antibiotic even if you start to feel better.  Avoid the following drinks: ? Alcohol. ? Caffeine. ? Tea. ? Carbonated drinks.  Drink enough fluid to keep your pee clear or pale yellow.  Keep all follow-up visits as told by your doctor. This is important.  Make sure to: ? Empty your bladder often and completely. Do not to hold pee for long periods of time. ? Empty your bladder before and after sex. ? Wipe from front to back after a bowel movement if you are female. Use each tissue one time when you wipe. Contact a doctor if:  You have back pain.  You have a fever.  You feel sick to your stomach (nauseous).  You throw up (vomit).  Your symptoms do not get better after 3 days.  Your symptoms go away and then come back. Get help right away if:  You have very bad back pain.  You have very bad lower belly (abdominal) pain.  You are throwing up and cannot keep down any medicines or water. This information is not intended to replace advice given to you by your health care provider. Make sure you discuss any questions you have with your health care provider. Document  Released: 01/19/2008 Document Revised: 01/08/2016 Document Reviewed: 06/23/2015 Elsevier Interactive Patient Education  2018 Elsevier Inc.    IF you received an x-ray today, you will receive an invoice from [facility]. Please contact [facility] at [phone] with questions or concerns regarding your invoice.   IF you received labwork today, you will receive an invoice from [facility]. Please contact [facility] at [phone] with questions or concerns regarding your invoice.   Our billing staff will not be able to assist you with questions regarding bills from these companies.  You will be contacted with the lab results as soon as they are available. The fastest way to get your results is to activate your My Chart account. Instructions are located on the last page of this paperwork. If you have not heard from us regarding the results in 2 weeks, please contact this office.     

## 2017-10-18 LAB — URINALYSIS, MICROSCOPIC ONLY: CASTS: NONE SEEN /LPF

## 2017-10-20 LAB — URINE CULTURE

## 2017-10-28 ENCOUNTER — Other Ambulatory Visit: Payer: Self-pay | Admitting: Physician Assistant

## 2017-10-28 ENCOUNTER — Telehealth: Payer: Self-pay | Admitting: Physician Assistant

## 2017-10-28 NOTE — Telephone Encounter (Signed)
Reviewed lab results and physician's note. She has completed the antibiotic and no longer has any symptoms.

## 2017-10-28 NOTE — Progress Notes (Signed)
Opened in error

## 2017-11-19 IMAGING — US US PELVIS COMPLETE
1 series · 13 of 25 positions shown · non-contrast
Comparison: 08/24/2016

CLINICAL DATA: RIGHT ovarian cyst, followup

EXAM:
TRANSABDOMINAL AND TRANSVAGINAL ULTRASOUND OF PELVIS
TECHNIQUE: Both transabdominal and transvaginal ultrasound examinations of the
pelvis were performed. Transabdominal technique was performed for
global imaging of the pelvis including uterus, ovaries, adnexal
regions, and pelvic cul-de-sac. It was necessary to proceed with
endovaginal exam following the transabdominal exam to visualize the
endometrium and adnexa.

[Series 1: us pelvis complete · 0.15mm/px · 13 of 79 slices shown]
[im 1/79]
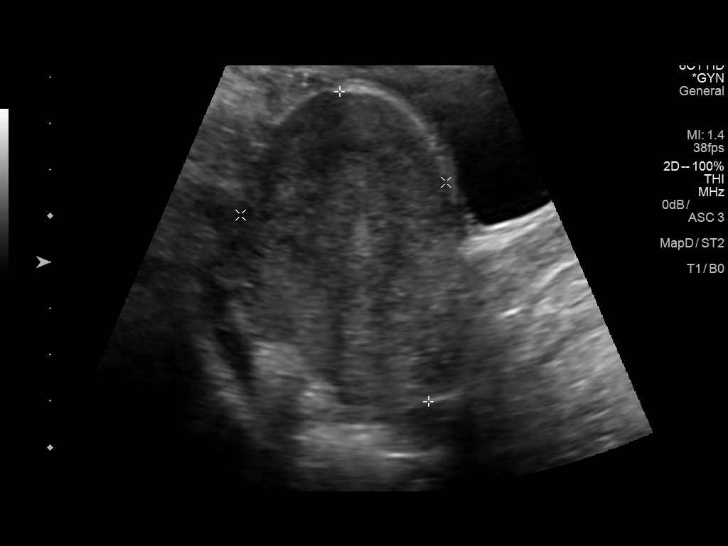
[im 7/79]
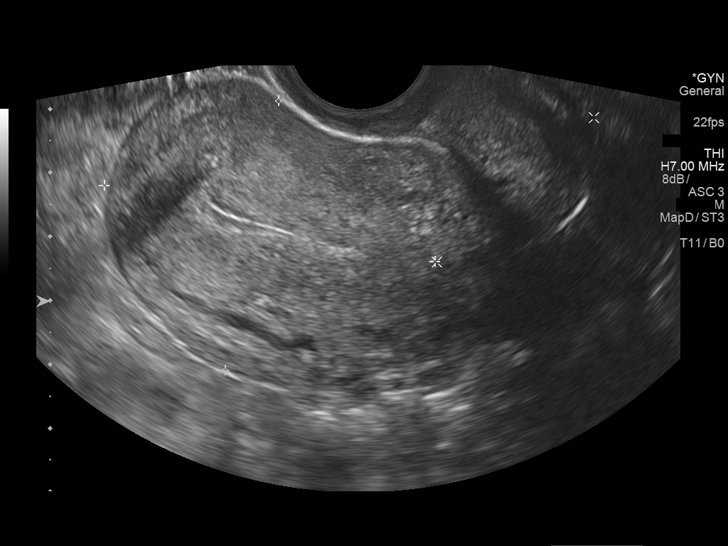
[im 14/79]
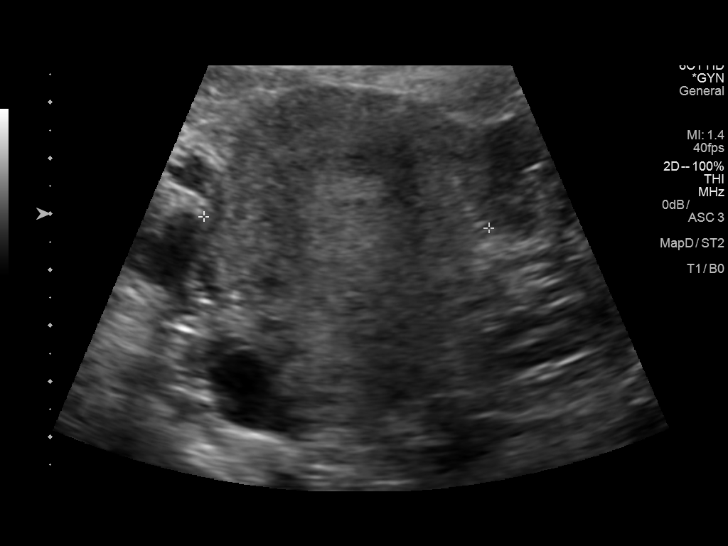
[im 20/79]
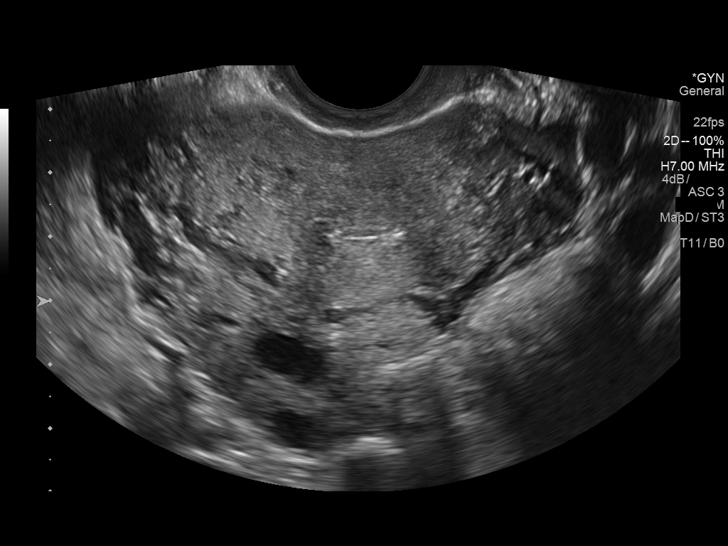
[im 27/79]
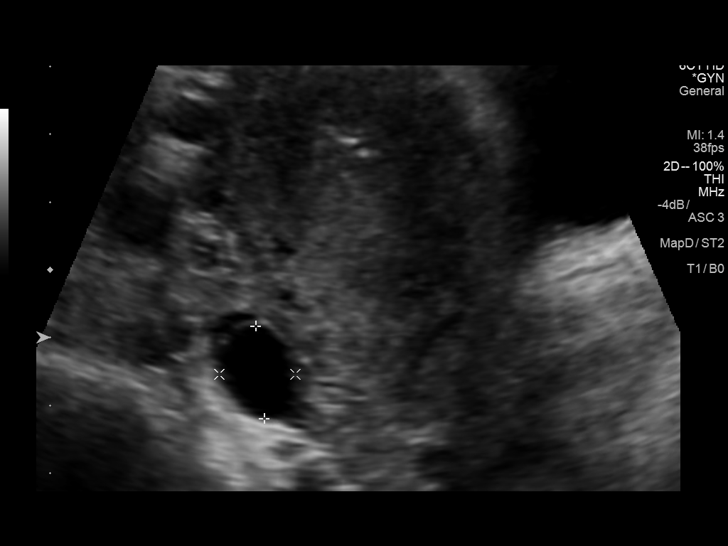
[im 33/79]
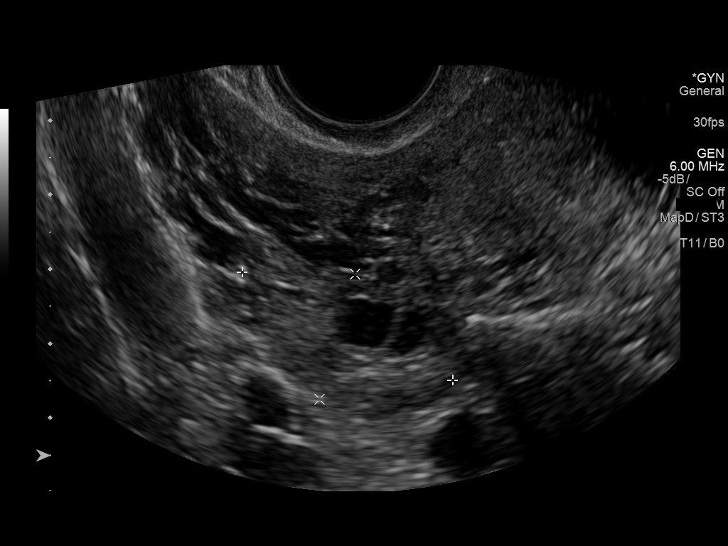
[im 40/79]
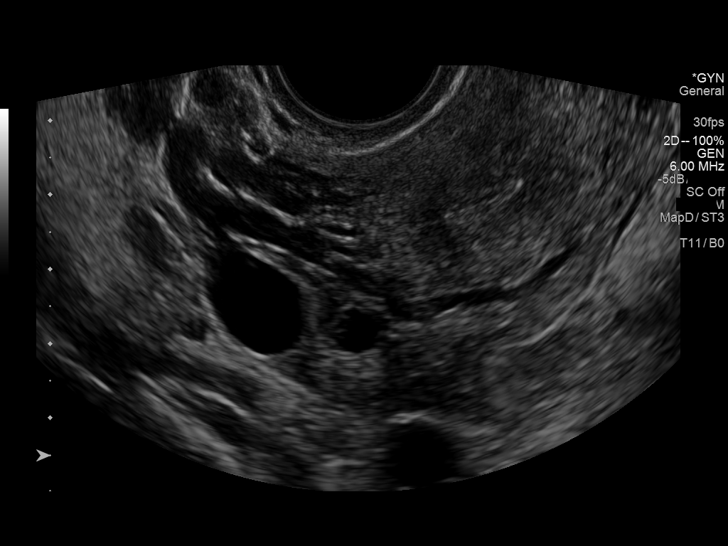
[im 46/79]
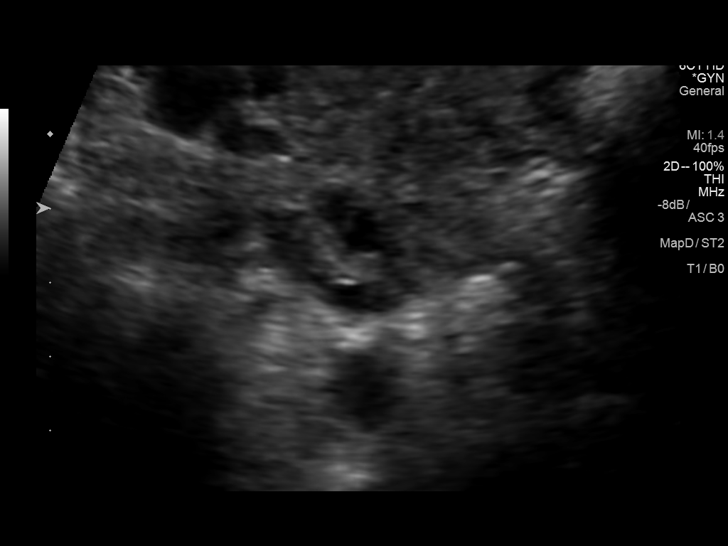
[im 53/79]
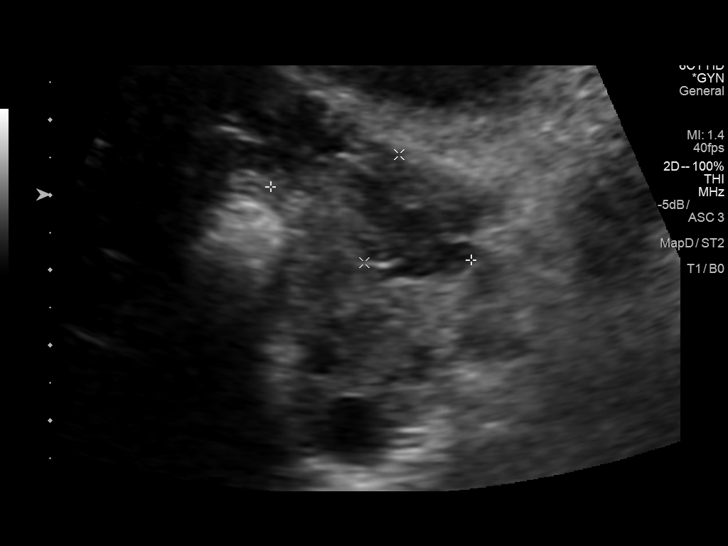
[im 59/79]
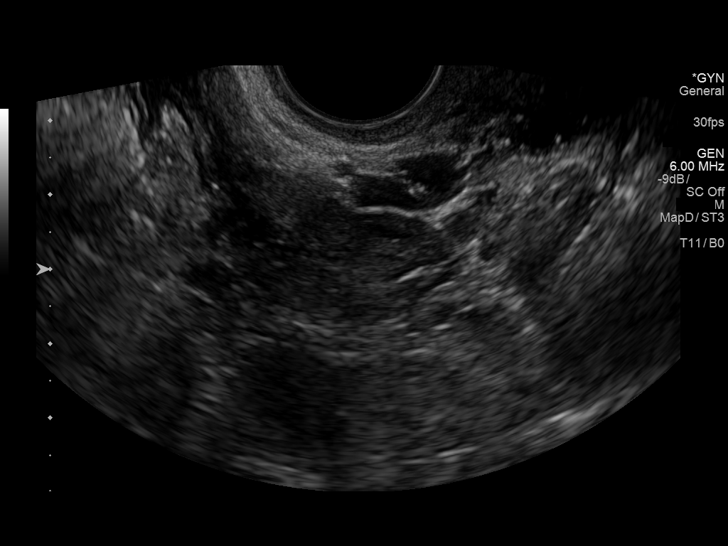
[im 66/79]
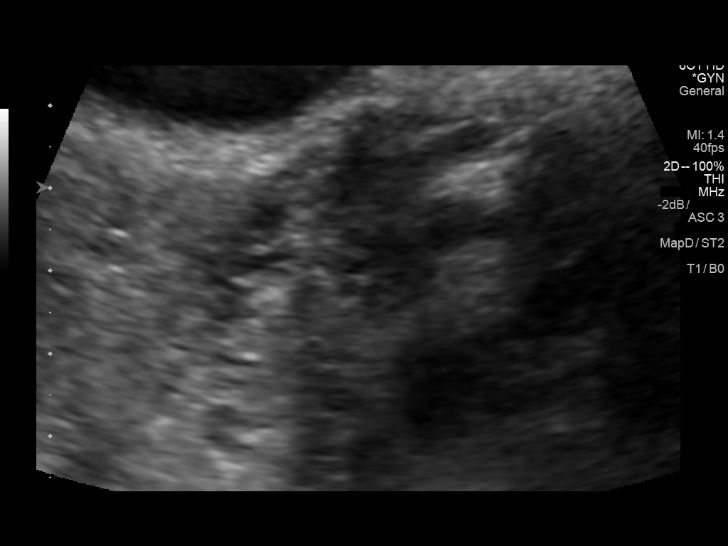
[im 72/79]
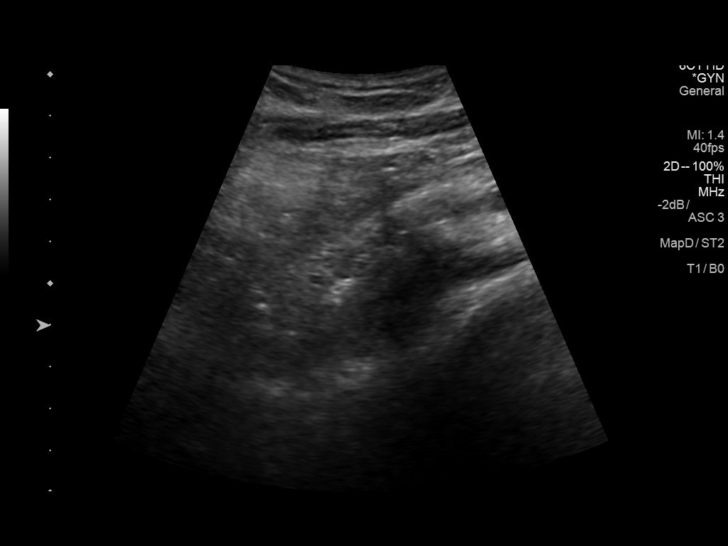
[im 79/79]
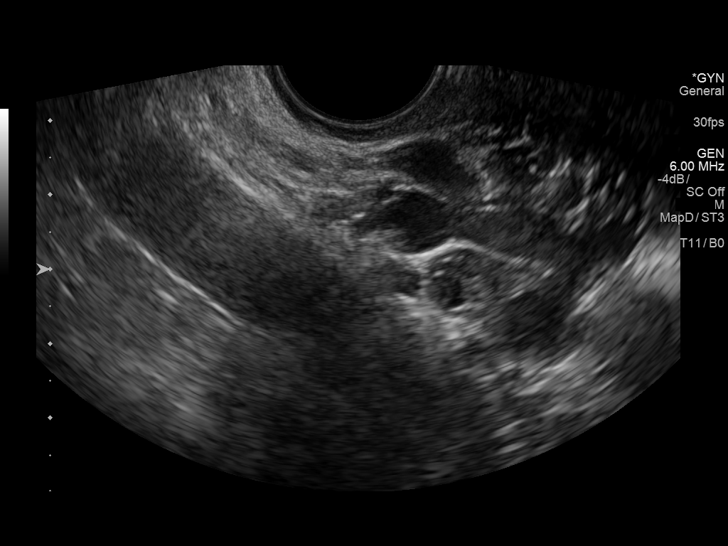

[13 of 25 positions shown; findings below may reference images not displayed]

FINDINGS: Uterus

Measurements: 8.6 x 4.3 x 5.2 cm. Normal morphology without mass

Endometrium

Thickness: 2 mm thick. Small subendometrial calcification at upper
uterine segment, nonspecific. No endometrial fluid or focal
abnormality.

Right ovary

Measurements: 3.1 x 1.7 x 1.5 cm. Small follicle cysts, largest
x 1.6 x 1.1 cm. No RIGHT ovarian mass. Complicated lesion seen
within the RIGHT ovary the previous exam resolved. Blood flow
present within RIGHT ovary on color Doppler imaging.

Left ovary

Measurements: 3.7 x 1.7 x 1.9 cm. Normal morphology without mass.
Blood flow seen within the margin of the LEFT ovary on color Doppler
imaging.

Other findings

No free pelvic fluid or adnexal masses.
IMPRESSION: Small subendometrial calcification at the upper uterine segment,
nonspecific.

Otherwise normal exam.

## 2018-02-24 DIAGNOSIS — R5383 Other fatigue: Secondary | ICD-10-CM | POA: Diagnosis not present

## 2018-07-27 DIAGNOSIS — E559 Vitamin D deficiency, unspecified: Secondary | ICD-10-CM | POA: Diagnosis not present

## 2019-04-16 DIAGNOSIS — R5383 Other fatigue: Secondary | ICD-10-CM | POA: Diagnosis not present

## 2019-04-26 DIAGNOSIS — R350 Frequency of micturition: Secondary | ICD-10-CM | POA: Diagnosis not present

## 2019-04-26 DIAGNOSIS — R3915 Urgency of urination: Secondary | ICD-10-CM | POA: Diagnosis not present

## 2019-06-14 DIAGNOSIS — N390 Urinary tract infection, site not specified: Secondary | ICD-10-CM | POA: Diagnosis not present

## 2019-06-14 DIAGNOSIS — R3915 Urgency of urination: Secondary | ICD-10-CM | POA: Diagnosis not present

## 2019-06-14 DIAGNOSIS — N3001 Acute cystitis with hematuria: Secondary | ICD-10-CM | POA: Diagnosis not present

## 2019-07-23 DIAGNOSIS — R3121 Asymptomatic microscopic hematuria: Secondary | ICD-10-CM | POA: Diagnosis not present

## 2019-07-23 DIAGNOSIS — N3000 Acute cystitis without hematuria: Secondary | ICD-10-CM | POA: Diagnosis not present

## 2019-08-06 DIAGNOSIS — R3121 Asymptomatic microscopic hematuria: Secondary | ICD-10-CM | POA: Diagnosis not present

## 2019-08-06 DIAGNOSIS — R3129 Other microscopic hematuria: Secondary | ICD-10-CM | POA: Diagnosis not present

## 2021-07-24 DIAGNOSIS — R0981 Nasal congestion: Secondary | ICD-10-CM | POA: Diagnosis not present

## 2021-07-24 DIAGNOSIS — Z20828 Contact with and (suspected) exposure to other viral communicable diseases: Secondary | ICD-10-CM | POA: Diagnosis not present

## 2021-10-06 DIAGNOSIS — N3000 Acute cystitis without hematuria: Secondary | ICD-10-CM | POA: Diagnosis not present

## 2021-10-27 DIAGNOSIS — N3000 Acute cystitis without hematuria: Secondary | ICD-10-CM | POA: Diagnosis not present

## 2022-02-11 ENCOUNTER — Ambulatory Visit (INDEPENDENT_AMBULATORY_CARE_PROVIDER_SITE_OTHER): Payer: BC Managed Care – PPO | Admitting: Radiology

## 2022-02-11 ENCOUNTER — Other Ambulatory Visit (HOSPITAL_COMMUNITY)
Admission: RE | Admit: 2022-02-11 | Discharge: 2022-02-11 | Disposition: A | Discharge status: Home / Self Care | Payer: BC Managed Care – PPO | Source: Ambulatory Visit | Attending: Radiology | Admitting: Radiology

## 2022-02-11 ENCOUNTER — Encounter: Payer: Self-pay | Admitting: Radiology

## 2022-02-11 VITALS — BP 102/56 | Ht 63.0 in | Wt 154.0 lb

## 2022-02-11 DIAGNOSIS — N912 Amenorrhea, unspecified: Secondary | ICD-10-CM

## 2022-02-11 DIAGNOSIS — Z01419 Encounter for gynecological examination (general) (routine) without abnormal findings: Secondary | ICD-10-CM | POA: Insufficient documentation

## 2022-02-11 LAB — PREGNANCY, URINE: Preg Test, Ur: NEGATIVE

## 2022-02-11 NOTE — Progress Notes (Signed)
   Kathy Rose 12/06/1975 [id number]   History:  46 y.o. G2P2 presents for annual exam. Late for menses, has had a tubal ligation. No other gyn concerns.   Gynecologic History Patient's last menstrual period was 12/31/2021 (exact date). Period Pattern: (!) Irregular Menstrual Flow: Moderate Dysmenorrhea: (!) Mild Dysmenorrhea Symptoms: Cramping Contraception/Family planning: tubal ligation Sexually active: yes Last Pap: 5/22.  Last mammogram: never  Obstetric History OB History  Gravida Para Term Preterm AB Living  2 2       2   SAB IAB Ectopic Multiple Live Births          2    # Outcome Date GA Lbr Len/2nd Weight Sex Delivery Anes PTL Lv  2 Para           1 Para              The following portions of the patient's history were reviewed and updated as appropriate: allergies, current medications, past family history, past medical history, past social history, past surgical history, and problem list.  Review of Systems Pertinent items noted in HPI and remainder of comprehensive ROS otherwise negative.   Past medical history, past surgical history, family history and social history were all reviewed and documented in the EPIC chart.   Exam:  Vitals:   02/11/22 1331  BP: (!) 102/56  Weight: 154 lb (69.9 kg)  Height: 5\' 3"  (1.6 m)   Body mass index is 27.28 kg/m.  General appearance:  Normal Thyroid:  Symmetrical, normal in size, without palpable masses or nodularity. Respiratory  Auscultation:  Clear without wheezing or rhonchi Cardiovascular  Auscultation:  Regular rate, without rubs, murmurs or gallops  Edema/varicosities:  Not grossly evident Abdominal  Soft,nontender, without masses, guarding or rebound.  Liver/spleen:  No organomegaly noted  Hernia:  None appreciated  Skin  Inspection:  Grossly normal Breasts: Examined lying and sitting.   Right: Without masses, retractions, nipple discharge or axillary adenopathy.   Left: Without masses,  retractions, nipple discharge or axillary adenopathy. Genitourinary   Inguinal/mons:  Normal without inguinal adenopathy  External genitalia:  Normal appearing vulva with no masses, tenderness, or lesions  BUS/Urethra/Skene's glands:  Normal without masses or exudate  Vagina:  Normal appearing with normal color and discharge, no lesions  Cervix:  Normal appearing without discharge or lesions  Uterus:  Normal in size, shape and contour.  Mobile, nontender  Adnexa/parametria:     Rt: Normal in size, without masses or tenderness.   Lt: Normal in size, without masses or tenderness.  Anus and perineum: Normal   Patient informed chaperone available to be present for breast and pelvic exam. Patient has requested no chaperone to be present. Patient has been advised what will be completed during breast and pelvic exam.   Assessment/Plan:   1. Well woman exam with routine gynecological exam - encouraged to schedule screening mammogram - Cytology - PAP( [place])  2. Amenorrhea Reassured pregnancy test negative - Pregnancy, urine    Discussed SBE, colonoscopy and DEXA screening as directed/appropriate. Recommend 02/13/22 of exercise weekly, including weight bearing exercise. Encouraged the use of seatbelts and sunscreen. Return in 1 year for annual or as needed.   B WHNP-BC 1:49 PM 02/11/2022

## 2022-02-15 LAB — CYTOLOGY - PAP
Comment: NEGATIVE
Diagnosis: NEGATIVE
High risk HPV: NEGATIVE

## 2022-02-19 ENCOUNTER — Telehealth: Payer: BC Managed Care – PPO | Admitting: Emergency Medicine

## 2022-02-19 DIAGNOSIS — N3000 Acute cystitis without hematuria: Secondary | ICD-10-CM

## 2022-02-19 MED ORDER — CEPHALEXIN 500 MG PO CAPS: 500.0000 mg | ORAL_CAPSULE | Freq: Two times a day (BID) | ORAL | 0 refills | Status: AC

## 2022-02-19 NOTE — Progress Notes (Signed)
E-Visit for Urinary Problems ? ?We are sorry that you are not feeling well.  Here is how we plan to help! ? ?Based on what you shared with me it looks like you most likely have a simple urinary tract infection. ? ?A UTI (Urinary Tract Infection) is a bacterial infection of the bladder. ? ?Most cases of urinary tract infections are simple to treat but a key part of your care is to encourage you to drink plenty of fluids and watch your symptoms carefully. ? ?I have prescribed Keflex 500 mg twice a day for 7 days.  Your symptoms should gradually improve. Call us if the burning in your urine worsens, you develop worsening fever, back pain or pelvic pain or if your symptoms do not resolve after completing the antibiotic. ? ?Urinary tract infections can be prevented by drinking plenty of water to keep your body hydrated.  Also be sure when you wipe, wipe from front to back and don't hold it in!  If possible, empty your bladder every 4 hours. ? ?HOME CARE ?Drink plenty of fluids ?Compete the full course of the antibiotics even if the symptoms resolve ?Remember, when you need to go?go. Holding in your urine can increase the likelihood of getting a UTI! ?GET HELP RIGHT AWAY IF: ?You cannot urinate ?You get a high fever ?Worsening back pain occurs ?You see blood in your urine ?You feel sick to your stomach or throw up ?You feel like you are going to pass out ? ?MAKE SURE YOU  ?Understand these instructions. ?Will watch your condition. ?Will get help right away if you are not doing well or get worse. ? ? ?Thank you for choosing an e-visit. ? ?Your e-visit answers were reviewed by a board certified advanced clinical practitioner to complete your personal care plan. Depending upon the condition, your plan could have included both over the counter or prescription medications. ? ?Please review your pharmacy choice. Make sure the pharmacy is open so you can pick up prescription now. If there is a problem, you may contact your  provider through MyChart messaging and have the prescription routed to another pharmacy.  Your safety is important to us. If you have drug allergies check your prescription carefully.  ? ?For the next 24 hours you can use MyChart to ask questions about today's visit, request a non-urgent call back, or ask for a work or school excuse. ?You will get an email in the next two days asking about your experience. I hope that your e-visit has been valuable and will speed your recovery. ? ?I have spent 5 minutes in review of e-visit questionnaire, review and updating patient chart, medical decision making and response to patient.  ? ?Brynlei Klausner, PhD, FNP-BC ?  ?

## 2022-03-26 ENCOUNTER — Telehealth: Payer: BC Managed Care – PPO | Admitting: Nurse Practitioner

## 2022-03-26 DIAGNOSIS — N3000 Acute cystitis without hematuria: Secondary | ICD-10-CM | POA: Diagnosis not present

## 2022-03-27 MED ORDER — SULFAMETHOXAZOLE-TRIMETHOPRIM 800-160 MG PO TABS: 1.0000 | ORAL_TABLET | Freq: Two times a day (BID) | ORAL | 0 refills | Status: AC

## 2022-03-27 NOTE — Progress Notes (Signed)
I have spent 5 minutes in review of e-visit questionnaire, review and updating patient chart, medical decision making and response to patient.  ° °Chinara Hertzberg W Peyson Delao, NP ° °  °

## 2022-03-27 NOTE — Progress Notes (Signed)

## 2022-04-01 ENCOUNTER — Other Ambulatory Visit (INDEPENDENT_AMBULATORY_CARE_PROVIDER_SITE_OTHER): Payer: BC Managed Care – PPO

## 2022-04-01 ENCOUNTER — Encounter: Payer: Self-pay | Admitting: Family Medicine

## 2022-04-01 ENCOUNTER — Ambulatory Visit (INDEPENDENT_AMBULATORY_CARE_PROVIDER_SITE_OTHER): Payer: BC Managed Care – PPO | Admitting: Family Medicine

## 2022-04-01 VITALS — BP 114/68 | HR 75 | Temp 97.6°F | Ht 63.0 in | Wt 155.0 lb

## 2022-04-01 DIAGNOSIS — N930 Postcoital and contact bleeding: Secondary | ICD-10-CM | POA: Diagnosis not present

## 2022-04-01 DIAGNOSIS — N390 Urinary tract infection, site not specified: Secondary | ICD-10-CM | POA: Diagnosis not present

## 2022-04-01 DIAGNOSIS — Z862 Personal history of diseases of the blood and blood-forming organs and certain disorders involving the immune mechanism: Secondary | ICD-10-CM

## 2022-04-01 DIAGNOSIS — R233 Spontaneous ecchymoses: Secondary | ICD-10-CM

## 2022-04-01 DIAGNOSIS — B001 Herpesviral vesicular dermatitis: Secondary | ICD-10-CM | POA: Insufficient documentation

## 2022-04-01 DIAGNOSIS — E559 Vitamin D deficiency, unspecified: Secondary | ICD-10-CM | POA: Diagnosis not present

## 2022-04-01 LAB — CBC WITH DIFFERENTIAL/PLATELET
Basophils Absolute: 0.1 10*3/uL (ref 0.0–0.1)
Basophils Relative: 1 % (ref 0.0–3.0)
Eosinophils Absolute: 0.1 10*3/uL (ref 0.0–0.7)
Eosinophils Relative: 2.2 % (ref 0.0–5.0)
HCT: 35.3 % — ABNORMAL LOW (ref 36.0–46.0)
Hemoglobin: 11.4 g/dL — ABNORMAL LOW (ref 12.0–15.0)
Lymphocytes Relative: 30.7 % (ref 12.0–46.0)
Lymphs Abs: 1.7 10*3/uL (ref 0.7–4.0)
MCHC: 32.3 g/dL (ref 30.0–36.0)
MCV: 84 fl (ref 78.0–100.0)
Monocytes Absolute: 0.6 10*3/uL (ref 0.1–1.0)
Monocytes Relative: 10.3 % (ref 3.0–12.0)
Neutro Abs: 3 10*3/uL (ref 1.4–7.7)
Neutrophils Relative %: 55.8 % (ref 43.0–77.0)
Platelets: 266 10*3/uL (ref 150.0–400.0)
RBC: 4.21 Mil/uL (ref 3.87–5.11)
RDW: 14.2 % (ref 11.5–15.5)
WBC: 5.4 10*3/uL (ref 4.0–10.5)

## 2022-04-01 LAB — COMPREHENSIVE METABOLIC PANEL
ALT: 13 U/L (ref 0–35)
AST: 21 U/L (ref 0–37)
Albumin: 4.3 g/dL (ref 3.5–5.2)
Alkaline Phosphatase: 61 U/L (ref 39–117)
BUN: 16 mg/dL (ref 6–23)
CO2: 27 mEq/L (ref 19–32)
Calcium: 9.2 mg/dL (ref 8.4–10.5)
Chloride: 102 mEq/L (ref 96–112)
Creatinine, Ser: 1.12 mg/dL (ref 0.40–1.20)
GFR: 59.58 mL/min — ABNORMAL LOW (ref >60.00)
Glucose, Bld: 91 mg/dL (ref 70–99)
Potassium: 4.1 mEq/L (ref 3.5–5.1)
Sodium: 135 mEq/L (ref 135–145)
Total Bilirubin: 0.3 mg/dL (ref 0.2–1.2)
Total Protein: 7.3 g/dL (ref 6.0–8.3)

## 2022-04-01 NOTE — Assessment & Plan Note (Signed)
Check labs 

## 2022-04-01 NOTE — Progress Notes (Signed)
New Patient Office Visit  Subjective    Patient ID: Kathy Rose, female    DOB: 07/12/1976  Age: 46 y.o. MRN: [id number]  CC:  Chief Complaint  Patient presents with   Establish Care    Noticed it has been taking her longer to heal recently, injured her foot and hand moving soon's console in 1st week of June and the swelling just went down within the last week and still has a slight knot.   Frequent UTIs, has been to specialist and nothing was found but wanted it to be known to see if there is anything to help prevent that.   Also mentions abnormal cramping for about a week not near her period time and after intercourse there is bleeding.    HPI Kathy Rose presents to establish care. Several concerns today.   C/o frequent UTIs. She is currently taking Bactrim for a UTI.  States she went to [facility] 2- 3 years ago to be evaluated for the same.   States she thinks she does not heal as well as in the past. States she injured her right hand and right foot in June and it took a long time to heal. She still has some mild swelling and a scab on the top of her right foot.  Reports easy bruising.   Hx of anemia.   Hx of vitamin D def   Takes Zyrtec for hx of urticaria.  She saw an allergist and had testing done.   She has noticed bleeding after intercourse. Denies dyspareunia, vaginal dryness or discharge.  She had a visit with her OB/GYN in June 2023 including a pelvic exam with pap but did not mention the post coital bleeding.   Denies fever, chills, night sweats, fatigue, unexplained weight loss, headaches, dizziness, chest pain, palpitations, shortness of breath, abdominal pain, N/V/D.    Married. 2 kids  Works in [employer]     Outpatient Encounter Medications as of 04/01/2022  Medication Sig   cetirizine (ZYRTEC) 10 MG chewable tablet Chew 10 mg by mouth daily.   Multiple Vitamins-Minerals (ZINC PO) Take by mouth.   sulfamethoxazole-trimethoprim (BACTRIM DS)  800-160 MG tablet Take 1 tablet by mouth 2 (two) times daily for 5 days.   No facility-administered encounter medications on file as of 04/01/2022.    History reviewed. No pertinent past medical history.  Past Surgical History:  Procedure Laterality Date   TUBAL LIGATION      Family History  Problem Relation Age of Onset   Diabetes Maternal Grandmother     Social History   Socioeconomic History   Marital status: Married    Spouse name: Not on file   Number of children: Not on file   Years of education: Not on file   Highest education level: Not on file  Occupational History   Not on file  Tobacco Use   Smoking status: Never   Smokeless tobacco: Never  Substance and Sexual Activity   Alcohol use: No   Drug use: No   Sexual activity: Yes    Partners: Male    Birth control/protection: Surgical    Comment: tubal  Other Topics Concern   Not on file  Social History Narrative   Not on file   Social Determinants of Health   Financial Resource Strain: Not on file  Food Insecurity: Not on file  Transportation Needs: Not on file  Physical Activity: Not on file  Stress: Not on file  Social Connections: Not on file  Intimate Partner Violence: Not on file    ROS Pertinent positives and negatives in the history of present illness.      Objective    BP 114/68 (BP Location: Left Arm, Patient Position: Sitting, Cuff Size: Large)   Pulse 75   Temp 97.6 F (36.4 C) (Temporal)   Ht 5\' 3"  (1.6 m)   Wt 155 lb (70.3 kg)   LMP 03/10/2022   SpO2 98%   BMI 27.46 kg/m   Physical Exam Constitutional:      General: She is not in acute distress.    Appearance: She is not ill-appearing.  HENT:     Mouth/Throat:     Mouth: Mucous membranes are moist.  Eyes:     Conjunctiva/sclera: Conjunctivae normal.  Cardiovascular:     Rate and Rhythm: Normal rate.  Pulmonary:     Effort: Pulmonary effort is normal.  Skin:    General: Skin is warm and dry.     Comments: Scab on  dorsum of right foot with mild swelling   Neurological:     General: No focal deficit present.     Mental Status: She is alert and oriented to person, place, and time.     Cranial Nerves: No cranial nerve deficit.     Motor: No weakness.     Gait: Gait normal.  Psychiatric:        Mood and Affect: Mood normal.        Behavior: Behavior normal.        Thought Content: Thought content normal.         Assessment & Plan:   Problem List Items Addressed This Visit       Genitourinary   Frequent UTI - Primary    Currently being treated for UTI. She will return in one week for a recheck of her urine (UA) to see if her urine is back to normal. She will see me if she has urinary symptoms.  I will request records from [facility] and [facility].         Other   Easy bruising    Check labs including platelets      Relevant Orders   CBC with Differential/Platelet   Comprehensive metabolic panel   TSH   Hx of iron deficiency anemia    Check labs.       Relevant Orders   CBC with Differential/Platelet   Comprehensive metabolic panel   Iron, TIBC and Ferritin Panel   PCB (post coital bleeding)    Recent pelvic exam and negative pap smear. I recommend follow up with OB/GYN to discuss      Relevant Orders   TSH   Vitamin D deficiency    Check vitamin D level. She is not currently taking a supplement.       Relevant Orders   VITAMIN D 25 Hydroxy (Vit-D Deficiency, Fractures)    Return for for urinalysis in one week .   [facility], NP-C

## 2022-04-01 NOTE — Assessment & Plan Note (Signed)
Currently being treated for UTI. She will return in one week for a recheck of her urine (UA) to see if her urine is back to normal. She will see me if she has urinary symptoms.  I will request records from [facility] and [facility].

## 2022-04-01 NOTE — Assessment & Plan Note (Signed)
Recent pelvic exam and negative pap smear. I recommend follow up with OB/GYN to discuss

## 2022-04-01 NOTE — Assessment & Plan Note (Signed)
Check labs including platelets

## 2022-04-01 NOTE — Assessment & Plan Note (Signed)
Check vitamin D level. She is not currently taking a supplement.

## 2022-04-01 NOTE — Patient Instructions (Signed)
Thank you for trusting [place] with your health care.  Please go to the [facility] location for labs before 5:30 PM today.  I recommend that you return for a urinalysis in 1 week.  If you are having urinary symptoms then please schedule to see me.  If you are not having urinary symptoms then you can come for a lab visit and leave a urine specimen.  We will be in touch with your results.

## 2022-04-02 LAB — IRON,TIBC AND FERRITIN PANEL
%SAT: 11 % (calc) — ABNORMAL LOW (ref 16–45)
Ferritin: 30 ng/mL (ref 16–232)
Iron: 37 ug/dL — ABNORMAL LOW (ref 40–190)
TIBC: 328 mcg/dL (calc) (ref 250–450)

## 2022-04-02 LAB — TSH: TSH: 1.84 u[IU]/mL (ref 0.35–5.50)

## 2022-04-02 LAB — VITAMIN D 25 HYDROXY (VIT D DEFICIENCY, FRACTURES): VITD: 53.48 ng/mL (ref 30.00–100.00)

## 2022-04-12 NOTE — Addendum Note (Signed)
Addended by: Marinus Maw on: 04/12/2022 03:28 PM   Modules accepted: Orders

## 2022-04-13 ENCOUNTER — Other Ambulatory Visit (INDEPENDENT_AMBULATORY_CARE_PROVIDER_SITE_OTHER): Payer: BC Managed Care – PPO

## 2022-04-13 DIAGNOSIS — N390 Urinary tract infection, site not specified: Secondary | ICD-10-CM

## 2022-04-13 LAB — URINALYSIS
Bilirubin Urine: NEGATIVE
Hgb urine dipstick: NEGATIVE
Ketones, ur: NEGATIVE
Leukocytes,Ua: NEGATIVE
Nitrite: NEGATIVE
Specific Gravity, Urine: 1.01 (ref 1.000–1.030)
Total Protein, Urine: NEGATIVE
Urine Glucose: NEGATIVE
Urobilinogen, UA: 0.2 (ref 0.0–1.0)
pH: 7 (ref 5.0–8.0)

## 2022-04-15 LAB — URINE CULTURE

## 2022-06-03 DIAGNOSIS — Z23 Encounter for immunization: Secondary | ICD-10-CM | POA: Diagnosis not present

## 2022-06-15 ENCOUNTER — Telehealth: Payer: BC Managed Care – PPO | Admitting: Physician Assistant

## 2022-06-15 DIAGNOSIS — R3989 Other symptoms and signs involving the genitourinary system: Secondary | ICD-10-CM

## 2022-06-15 MED ORDER — SULFAMETHOXAZOLE-TRIMETHOPRIM 800-160 MG PO TABS: 1.0000 | ORAL_TABLET | Freq: Two times a day (BID) | ORAL | 0 refills | Status: DC

## 2022-06-15 NOTE — Progress Notes (Signed)

## 2022-06-15 NOTE — Progress Notes (Signed)
I have spent 5 minutes in review of e-visit questionnaire, review and updating patient chart, medical decision making and response to patient.   Mayreli Alden Cody Arlo Buffone, PA-C    

## 2022-07-05 ENCOUNTER — Encounter: Payer: Self-pay | Admitting: Family Medicine

## 2022-07-05 NOTE — Telephone Encounter (Signed)
PCP unavailable this week so scheduled with Dr. Alvy Bimler for this Wednesday

## 2022-07-05 NOTE — Telephone Encounter (Signed)
Pt had UTI on 06/15/22 and was prescribed Bactrim, however, she didn't think it was working. She is feeling the urgency again and would like a lab order to check to see if it is back. Are you ok with just a lab visit?

## 2022-07-07 ENCOUNTER — Ambulatory Visit (INDEPENDENT_AMBULATORY_CARE_PROVIDER_SITE_OTHER): Payer: BC Managed Care – PPO | Admitting: Emergency Medicine

## 2022-07-07 ENCOUNTER — Encounter: Payer: Self-pay | Admitting: Emergency Medicine

## 2022-07-07 VITALS — BP 108/72 | HR 67 | Temp 98.5°F | Ht 63.0 in | Wt 157.1 lb

## 2022-07-07 DIAGNOSIS — R300 Dysuria: Secondary | ICD-10-CM

## 2022-07-07 DIAGNOSIS — B999 Unspecified infectious disease: Secondary | ICD-10-CM | POA: Diagnosis not present

## 2022-07-07 DIAGNOSIS — N390 Urinary tract infection, site not specified: Secondary | ICD-10-CM | POA: Diagnosis not present

## 2022-07-07 LAB — POCT URINALYSIS DIPSTICK
Bilirubin, UA: NEGATIVE
Glucose, UA: NEGATIVE
Ketones, UA: NEGATIVE
Leukocytes, UA: NEGATIVE
Nitrite, UA: NEGATIVE
Protein, UA: NEGATIVE
Spec Grav, UA: 1.01 (ref 1.010–1.025)
Urobilinogen, UA: 0.2 E.U./dL
pH, UA: 7 (ref 5.0–8.0)

## 2022-07-07 MED ORDER — CEFUROXIME AXETIL 500 MG PO TABS: 500.0000 mg | ORAL_TABLET | Freq: Two times a day (BID) | ORAL | 0 refills | Status: AC

## 2022-07-07 NOTE — Assessment & Plan Note (Signed)
Symptoms suggestive of UTI. Recent urine culture was negative. Was treated with Bactrim last month Repeat urine culture today We will start different antibiotic, Ceftin 500 mg twice a day for 7 days ED precautions given.

## 2022-07-07 NOTE — Progress Notes (Signed)
Kathy Rose 46 y.o.   Chief Complaint  Patient presents with   Acute Visit    UTI symptoms, urinary frequency, bladder spasm.     HISTORY OF PRESENT ILLNESS: This is a 46 y.o. female complaining of urinary urgency and frequency for the last 3 days. Had UTI last month which was treated with Bactrim for 7 days.  Urine culture came back negative. Denies fever.  No hematuria.  Denies nausea or vomiting.  Denies flank or abdominal pain.  Able to eat and drink. No other associated symptoms.  No other complaints or medical concerns today.  HPI   Prior to Admission medications   Medication Sig Start Date End Date Taking? Authorizing Provider  cetirizine (ZYRTEC) 10 MG chewable tablet Chew 10 mg by mouth daily.    [provider]  Multiple Vitamins-Minerals (ZINC PO) Take by mouth.    [provider]    Allergies  Allergen Reactions   Albuterol     Patient indicates this made her tongue feel "funny"   Claritin [Loratadine]     Patient Active Problem List   Diagnosis Date Noted   Cold sore 04/01/2022   Frequent UTI 04/01/2022   Easy bruising 04/01/2022   Vitamin D deficiency 04/01/2022   Hx of iron deficiency anemia 04/01/2022   PCB (post coital bleeding) 04/01/2022   Frequent menstruation 08/03/2013    No past medical history on file.  Past Surgical History:  Procedure Laterality Date   TUBAL LIGATION      Social History   Socioeconomic History   Marital status: Married    Spouse name: Not on file   Number of children: Not on file   Years of education: Not on file   Highest education level: Not on file  Occupational History   Not on file  Tobacco Use   Smoking status: Never   Smokeless tobacco: Never  Substance and Sexual Activity   Alcohol use: No   Drug use: No   Sexual activity: Yes    Partners: Male    Birth control/protection: Surgical    Comment: tubal  Other Topics Concern   Not on file  Social History Narrative   Not on  file   Social Determinants of Health   Financial Resource Strain: Not on file  Food Insecurity: Not on file  Transportation Needs: Not on file  Physical Activity: Not on file  Stress: Not on file  Social Connections: Not on file  Intimate Partner Violence: Not on file    Family History  Problem Relation Age of Onset   Diabetes Maternal Grandmother      Review of Systems  Constitutional: Negative.  Negative for chills and fever.  HENT:  Negative for congestion and sore throat.   Respiratory: Negative.  Negative for cough.   Cardiovascular: Negative.   Gastrointestinal: Negative.  Negative for abdominal pain, nausea and vomiting.  Genitourinary:  Positive for dysuria, frequency and urgency. Negative for flank pain and hematuria.  Neurological: Negative.  Negative for dizziness and headaches.  All other systems reviewed and are negative.  Today's Vitals   07/07/22 1334  BP: 108/72  Pulse: 67  Temp: 98.5 F (36.9 C)  TempSrc: Oral  SpO2: 99%  Weight: 157 lb 2 oz (71.3 kg)  Height: 5\' 3"  (1.6 m)   Body mass index is 27.83 kg/m.   Physical Exam Vitals reviewed.  Constitutional:      Appearance: Normal appearance.  HENT:     Head: Normocephalic.  Eyes:  Extraocular Movements: Extraocular movements intact.  Cardiovascular:     Rate and Rhythm: Normal rate.  Pulmonary:     Effort: Pulmonary effort is normal.  Abdominal:     Palpations: Abdomen is soft.     Tenderness: There is no abdominal tenderness. There is no right CVA tenderness or left CVA tenderness.  Skin:    General: Skin is warm and dry.  Neurological:     General: No focal deficit present.     Mental Status: She is alert and oriented to person, place, and time.  Psychiatric:        Mood and Affect: Mood normal.        Behavior: Behavior normal.    Results for orders placed or performed in visit on 07/07/22 (from the past 24 hour(s))  POCT Urinalysis Dipstick     Status: Normal   Collection  Time: 07/07/22  2:03 PM  Result Value Ref Range   Color, UA yellow    Clarity, UA clear    Glucose, UA Negative Negative   Bilirubin, UA negative    Ketones, UA negative    Spec Grav, UA 1.010 1.010 - 1.025   Blood, UA trace    pH, UA 7.0 5.0 - 8.0   Protein, UA Negative Negative   Urobilinogen, UA 0.2 0.2 or 1.0 E.U./dL   Nitrite, UA negative    Leukocytes, UA Negative Negative   Appearance     Odor       ASSESSMENT & PLAN: A total of 46 minutes was spent with the patient and counseling/coordination of care regarding preparing for this visit, review of most recent office visit notes, review of chronic medical conditions, review of all medications, review of most recent urinalysis and urine culture, review of today's urinalysis, diagnosis of partially treated UTI and need to start new antibiotic, ED precautions, prognosis, documentation, and need for follow-up.  Problem List Items Addressed This Visit       Genitourinary   Frequent UTI    Likely dealing with partially treated infection. Repeat urine culture and start different antibiotic today.      Relevant Medications   cefUROXime (CEFTIN) 500 MG tablet   Other Relevant Orders   POCT Urinalysis Dipstick (Completed)   Urine Culture     Other   Clinical infection    Symptoms suggestive of UTI. Recent urine culture was negative. Was treated with Bactrim last month Repeat urine culture today We will start different antibiotic, Ceftin 500 mg twice a day for 7 days ED precautions given.      Relevant Medications   cefUROXime (CEFTIN) 500 MG tablet   Dysuria - Primary    Likely secondary to urinary tract infection. Does not like to use Azo/Pyridium Start antibiotic today. Advised to stay well-hydrated ED precautions given      Relevant Medications   cefUROXime (CEFTIN) 500 MG tablet   Patient Instructions  Urinary Tract Infection, Adult A urinary tract infection (UTI) is an infection of any part of the urinary  tract. The urinary tract includes: The kidneys. The ureters. The bladder. The urethra. These organs make, store, and get rid of pee (urine) in the body. What are the causes? This infection is caused by germs (bacteria) in your genital area. These germs grow and cause swelling (inflammation) of your urinary tract. What increases the risk? The following factors may make you more likely to develop this condition: Using a small, thin tube (catheter) to drain pee. Not being able to control  when you pee or poop (incontinence). Being female. If you are female, these things can increase the risk: Using these methods to prevent pregnancy: A medicine that kills sperm (spermicide). A device that blocks sperm (diaphragm). Having low levels of a female hormone (estrogen). Being pregnant. You are more likely to develop this condition if: You have genes that add to your risk. You are sexually active. You take antibiotic medicines. You have trouble peeing because of: A prostate that is bigger than normal, if you are female. A blockage in the part of your body that drains pee from the bladder. A kidney stone. A nerve condition that affects your bladder. Not getting enough to drink. Not peeing often enough. You have other conditions, such as: Diabetes. A weak disease-fighting system (immune system). Sickle cell disease. Gout. Injury of the spine. What are the signs or symptoms? Symptoms of this condition include: Needing to pee right away. Peeing small amounts often. Pain or burning when peeing. Blood in the pee. Pee that smells bad or not like normal. Trouble peeing. Pee that is cloudy. Fluid coming from the vagina, if you are female. Pain in the belly or lower back. Other symptoms include: Vomiting. Not feeling hungry. Feeling mixed up (confused). This may be the first symptom in older adults. Being tired and grouchy (irritable). A fever. Watery poop (diarrhea). How is this  treated? Taking antibiotic medicine. Taking other medicines. Drinking enough water. In some cases, you may need to see a specialist. Follow these instructions at home:  Medicines Take over-the-counter and prescription medicines only as told by your doctor. If you were prescribed an antibiotic medicine, take it as told by your doctor. Do not stop taking it even if you start to feel better. General instructions Make sure you: Pee until your bladder is empty. Do not hold pee for a long time. Empty your bladder after sex. Wipe from front to back after peeing or pooping if you are a female. Use each tissue one time when you wipe. Drink enough fluid to keep your pee pale yellow. Keep all follow-up visits. Contact a doctor if: You do not get better after 1-2 days. Your symptoms go away and then come back. Get help right away if: You have very bad back pain. You have very bad pain in your lower belly. You have a fever. You have chills. You feeling like you will vomit or you vomit. Summary A urinary tract infection (UTI) is an infection of any part of the urinary tract. This condition is caused by germs in your genital area. There are many risk factors for a UTI. Treatment includes antibiotic medicines. Drink enough fluid to keep your pee pale yellow. This information is not intended to replace advice given to you by your health care provider. Make sure you discuss any questions you have with your health care provider. Document Revised: 03/14/2020 Document Reviewed: 03/14/2020 Elsevier Patient Education  2023 Elsevier Inc.    Edwina Barth, MD [facility]

## 2022-07-07 NOTE — Assessment & Plan Note (Signed)
Likely dealing with partially treated infection. Repeat urine culture and start different antibiotic today.

## 2022-07-07 NOTE — Patient Instructions (Signed)
Urinary Tract Infection, Adult A urinary tract infection (UTI) is an infection of any part of the urinary tract. The urinary tract includes: The kidneys. The ureters. The bladder. The urethra. These organs make, store, and get rid of pee (urine) in the body. What are the causes? This infection is caused by germs (bacteria) in your genital area. These germs grow and cause swelling (inflammation) of your urinary tract. What increases the risk? The following factors may make you more likely to develop this condition: Using a small, thin tube (catheter) to drain pee. Not being able to control when you pee or poop (incontinence). Being female. If you are female, these things can increase the risk: Using these methods to prevent pregnancy: A medicine that kills sperm (spermicide). A device that blocks sperm (diaphragm). Having low levels of a female hormone (estrogen). Being pregnant. You are more likely to develop this condition if: You have genes that add to your risk. You are sexually active. You take antibiotic medicines. You have trouble peeing because of: A prostate that is bigger than normal, if you are female. A blockage in the part of your body that drains pee from the bladder. A kidney stone. A nerve condition that affects your bladder. Not getting enough to drink. Not peeing often enough. You have other conditions, such as: Diabetes. A weak disease-fighting system (immune system). Sickle cell disease. Gout. Injury of the spine. What are the signs or symptoms? Symptoms of this condition include: Needing to pee right away. Peeing small amounts often. Pain or burning when peeing. Blood in the pee. Pee that smells bad or not like normal. Trouble peeing. Pee that is cloudy. Fluid coming from the vagina, if you are female. Pain in the belly or lower back. Other symptoms include: Vomiting. Not feeling hungry. Feeling mixed up (confused). This may be the first symptom in  older adults. Being tired and grouchy (irritable). A fever. Watery poop (diarrhea). How is this treated? Taking antibiotic medicine. Taking other medicines. Drinking enough water. In some cases, you may need to see a specialist. Follow these instructions at home:  Medicines Take over-the-counter and prescription medicines only as told by your doctor. If you were prescribed an antibiotic medicine, take it as told by your doctor. Do not stop taking it even if you start to feel better. General instructions Make sure you: Pee until your bladder is empty. Do not hold pee for a long time. Empty your bladder after sex. Wipe from front to back after peeing or pooping if you are a female. Use each tissue one time when you wipe. Drink enough fluid to keep your pee pale yellow. Keep all follow-up visits. Contact a doctor if: You do not get better after 1-2 days. Your symptoms go away and then come back. Get help right away if: You have very bad back pain. You have very bad pain in your lower belly. You have a fever. You have chills. You feeling like you will vomit or you vomit. Summary A urinary tract infection (UTI) is an infection of any part of the urinary tract. This condition is caused by germs in your genital area. There are many risk factors for a UTI. Treatment includes antibiotic medicines. Drink enough fluid to keep your pee pale yellow. This information is not intended to replace advice given to you by your health care provider. Make sure you discuss any questions you have with your health care provider. Document Revised: 03/14/2020 Document Reviewed: 03/14/2020 Elsevier Patient Education    2023 Elsevier Inc.  

## 2022-07-07 NOTE — Assessment & Plan Note (Addendum)
Likely secondary to urinary tract infection. Does not like to use Azo/Pyridium Start antibiotic today. Advised to stay well-hydrated ED precautions given

## 2022-07-08 LAB — URINE CULTURE

## 2022-07-27 ENCOUNTER — Encounter: Payer: Self-pay | Admitting: Family Medicine

## 2022-07-27 ENCOUNTER — Telehealth: Payer: BC Managed Care – PPO | Admitting: Physician Assistant

## 2022-07-27 DIAGNOSIS — N390 Urinary tract infection, site not specified: Secondary | ICD-10-CM

## 2022-07-27 NOTE — Telephone Encounter (Signed)
Do you want pt to come in office again for further testing?

## 2022-07-28 NOTE — Progress Notes (Signed)
Because of frequent UTI with last treatment a few weeks ago and concern for resistance to some antibiotics now, I feel your condition warrants further evaluation and I recommend that you be seen in a face to face visit. You need a repeat urine culture so we can make sure the best antibiotics are chosen for your specific infection.    NOTE: There will be NO CHARGE for this eVisit   If you are having a true medical emergency please call 911.      For an urgent face to face visit, [place] has seven urgent care centers for your convenience:     [facility] Health Urgent Care Center at [facility] Directions [phone] [address] [place], [place] [address]    [facility] Health Urgent Care Center [facility]) Get Driving Directions [phone] [address] [place], [place] [address]  [facility] Health Urgent Care Center [facility]) Get Driving Directions [phone] [address] [place],  [place]  [address]  [facility] Health Urgent Care Center [facility] - at [facility] Directions  [phone] [address] [place],  [place] [address]   [facility] Health Urgent Care at [facility] Get Driving Directions [phone] [place], [place] [address]   [facility] Health Urgent Care at [facility] Get Driving Directions  [phone] [address] [place], [place] [address]   [facility] Health Urgent Care at [facility] Directions [phone] [address] [place], [place] [address]  Your MyChart E-visit questionnaire answers were reviewed by a board certified advanced clinical practitioner to complete your personal care plan based on your specific symptoms.  Thank you for using e-Visits.

## 2022-07-30 ENCOUNTER — Encounter: Payer: Self-pay | Admitting: Family Medicine

## 2022-07-30 ENCOUNTER — Ambulatory Visit (INDEPENDENT_AMBULATORY_CARE_PROVIDER_SITE_OTHER): Payer: BC Managed Care – PPO | Admitting: Family Medicine

## 2022-07-30 VITALS — BP 124/70 | HR 67 | Temp 97.6°F | Ht 63.0 in | Wt 155.0 lb

## 2022-07-30 DIAGNOSIS — Z87440 Personal history of urinary (tract) infections: Secondary | ICD-10-CM

## 2022-07-30 DIAGNOSIS — R350 Frequency of micturition: Secondary | ICD-10-CM

## 2022-07-30 DIAGNOSIS — R319 Hematuria, unspecified: Secondary | ICD-10-CM | POA: Diagnosis not present

## 2022-07-30 LAB — URINALYSIS, MICROSCOPIC ONLY

## 2022-07-30 LAB — POCT URINALYSIS DIPSTICK
Bilirubin, UA: NEGATIVE
Glucose, UA: NEGATIVE
Ketones, UA: NEGATIVE
Leukocytes, UA: NEGATIVE
Nitrite, UA: NEGATIVE
Protein, UA: POSITIVE — AB
Spec Grav, UA: 1.025 (ref 1.010–1.025)
Urobilinogen, UA: 0.2 E.U./dL
pH, UA: 6.5 (ref 5.0–8.0)

## 2022-07-30 NOTE — Patient Instructions (Signed)
I referred you to Dr. Kasandra Knudsen at [facility] and they will call you to schedule.   We will be in touch with your urine culture results.   Continue good hydration and cranberry supplement.

## 2022-07-30 NOTE — Progress Notes (Unsigned)
Subjective:     Patient ID: Sigmund Hazel, female    DOB: [birth year], 46 y.o.   MRN: [id number]  Chief Complaint  Patient presents with   Urinary Frequency    Also experiencing bladder contractions starting Tuesday, has been taking cranberry pills    HPI Patient is in today for a 4 day hx of urinary frequency, urgency and bladder spasms.  Hx of recurrent UTIs.   Using cranberry supplements.   She has seen urologist in the past.      Health Maintenance Due  Topic Date Due   COVID-19 Vaccine (1) Never done   HIV Screening  Never done   Hepatitis C Screening  Never done   DTaP/Tdap/Td (2 - Td or Tdap) 02/10/2022    History reviewed. No pertinent past medical history.  Past Surgical History:  Procedure Laterality Date   TUBAL LIGATION      Family History  Problem Relation Age of Onset   Diabetes Maternal Grandmother     Social History   Socioeconomic History   Marital status: Married    Spouse name: Not on file   Number of children: Not on file   Years of education: Not on file   Highest education level: Not on file  Occupational History   Not on file  Tobacco Use   Smoking status: Never   Smokeless tobacco: Never  Substance and Sexual Activity   Alcohol use: No   Drug use: No   Sexual activity: Yes    Partners: Male    Birth control/protection: Surgical    Comment: tubal  Other Topics Concern   Not on file  Social History Narrative   Not on file   Social Determinants of Health   Financial Resource Strain: Not on file  Food Insecurity: Not on file  Transportation Needs: Not on file  Physical Activity: Not on file  Stress: Not on file  Social Connections: Not on file  Intimate Partner Violence: Not on file    Outpatient Medications Prior to Visit  Medication Sig Dispense Refill   cetirizine (ZYRTEC) 10 MG chewable tablet Chew 10 mg by mouth daily.     Multiple Vitamins-Minerals (ZINC PO) Take by mouth.     No facility-administered  medications prior to visit.    Allergies  Allergen Reactions   Albuterol     Patient indicates this made her tongue feel "funny"   Claritin [Loratadine]     ROS     Objective:    Physical Exam  BP 124/70 (BP Location: Left Arm, Patient Position: Sitting, Cuff Size: Large)   Pulse 67   Temp 97.6 F (36.4 C) (Temporal)   Ht 5\' 3"  (1.6 m)   Wt 155 lb (70.3 kg)   SpO2 99%   BMI 27.46 kg/m  Wt Readings from Last 3 Encounters:  07/30/22 155 lb (70.3 kg)  07/07/22 157 lb 2 oz (71.3 kg)  04/01/22 155 lb (70.3 kg)       Assessment & Plan:   Problem List Items Addressed This Visit   None Visit Diagnoses     Increased frequency of urination    -  Primary   Relevant Orders   Urine Microscopic Only   POCT urinalysis dipstick (Completed)   Ambulatory referral to Urology   Hematuria, unspecified type       Relevant Orders   Urine Microscopic Only   Ambulatory referral to Urology   Frequent urination       History of recurrent  UTI (urinary tract infection)       Relevant Orders   Ambulatory referral to Urology       I am having Sigmund Hazel "Bryson Ha" maintain her Multiple Vitamins-Minerals (ZINC PO) and cetirizine.  No orders of the defined types were placed in this encounter.

## 2022-08-01 LAB — URINE CULTURE

## 2022-08-03 ENCOUNTER — Other Ambulatory Visit: Payer: Self-pay | Admitting: Family Medicine

## 2022-08-03 MED ORDER — NITROFURANTOIN MONOHYD MACRO 100 MG PO CAPS: 100.0000 mg | ORAL_CAPSULE | Freq: Two times a day (BID) | ORAL | 0 refills | Status: DC

## 2022-08-03 NOTE — Progress Notes (Signed)
Please let her know that I sent in an antibiotic prescription due to her recent urinary symptoms and her urine culture is borderline for infection. I also recommend follow up with urologist as discussed.

## 2022-10-21 DIAGNOSIS — Z1231 Encounter for screening mammogram for malignant neoplasm of breast: Secondary | ICD-10-CM | POA: Diagnosis not present

## 2022-10-21 LAB — HM MAMMOGRAPHY

## 2022-10-26 ENCOUNTER — Encounter: Payer: Self-pay | Admitting: Family Medicine

## 2022-12-07 ENCOUNTER — Encounter: Payer: Self-pay | Admitting: Family Medicine

## 2022-12-09 ENCOUNTER — Encounter: Payer: Self-pay | Admitting: Family Medicine

## 2022-12-09 ENCOUNTER — Ambulatory Visit: Payer: BC Managed Care – PPO | Admitting: Family Medicine

## 2022-12-09 VITALS — BP 108/72 | HR 60 | Temp 97.6°F | Ht 63.0 in | Wt 154.0 lb

## 2022-12-09 DIAGNOSIS — H9311 Tinnitus, right ear: Secondary | ICD-10-CM | POA: Diagnosis not present

## 2022-12-09 DIAGNOSIS — R300 Dysuria: Secondary | ICD-10-CM

## 2022-12-09 LAB — POC URINALSYSI DIPSTICK (AUTOMATED)
Bilirubin, UA: NEGATIVE
Glucose, UA: NEGATIVE
Ketones, UA: NEGATIVE
Leukocytes, UA: NEGATIVE
Nitrite, UA: NEGATIVE
Protein, UA: NEGATIVE
Spec Grav, UA: 1.025 (ref 1.010–1.025)
Urobilinogen, UA: 0.2 E.U./dL
pH, UA: 6 (ref 5.0–8.0)

## 2022-12-09 NOTE — Patient Instructions (Signed)
We will be in touch with your urine culture result. Drink plenty of water.   If the noise in your right ear returns, let me know.

## 2022-12-09 NOTE — Progress Notes (Signed)
Subjective:     Patient ID: Kathy Rose, female    DOB: [birth year], 47 y.o.   MRN: [id number]  Chief Complaint  Patient presents with   ear swooshing    Right ear was swooshing for about a week but stopped this morning.     HPI Patient is in today for multiple complaints.   States she had a low pitched whooshing noise in her right ear that was present over the weekend and has been improving over the past 2 days and is completely resolved as of today.  No fever, chills, headache, dizziness, vision changes, neck pain, chest pain, palpitations, shortness of breath, N/V/D.   She c/o a tingling sensation with urination this morning. Hx of UTI. No urinary frequency, urgency, abdominal pain or back pain.   Health Maintenance Due  Topic Date Due   HIV Screening  Never done   Hepatitis C Screening  Never done   DTaP/Tdap/Td (2 - Td or Tdap) 02/10/2022    History reviewed. No pertinent past medical history.  Past Surgical History:  Procedure Laterality Date   TUBAL LIGATION      Family History  Problem Relation Age of Onset   Diabetes Maternal Grandmother     Social History   Socioeconomic History   Marital status: Married    Spouse name: Not on file   Number of children: Not on file   Years of education: Not on file   Highest education level: Not on file  Occupational History   Not on file  Tobacco Use   Smoking status: Never   Smokeless tobacco: Never  Substance and Sexual Activity   Alcohol use: No   Drug use: No   Sexual activity: Yes    Partners: Male    Birth control/protection: Surgical    Comment: tubal  Other Topics Concern   Not on file  Social History Narrative   Not on file   Social Determinants of Health   Financial Resource Strain: Not on file  Food Insecurity: Not on file  Transportation Needs: Not on file  Physical Activity: Not on file  Stress: Not on file  Social Connections: Not on file  Intimate Partner Violence: Not on file     Outpatient Medications Prior to Visit  Medication Sig Dispense Refill   cetirizine (ZYRTEC) 10 MG chewable tablet Chew 10 mg by mouth daily.     Multiple Vitamins-Minerals (ZINC PO) Take by mouth.     nitrofurantoin, macrocrystal-monohydrate, (MACROBID) 100 MG capsule Take 1 capsule (100 mg total) by mouth 2 (two) times daily. 10 capsule 0   No facility-administered medications prior to visit.    Allergies  Allergen Reactions   Albuterol     Patient indicates this made her tongue feel "funny"   Claritin [Loratadine]     ROS     Objective:    Physical Exam Constitutional:      General: She is not in acute distress.    Appearance: She is not ill-appearing.  HENT:     Head: Atraumatic.     Jaw: No pain on movement.     Right Ear: Tympanic membrane, ear canal and external ear normal. No decreased hearing noted. No drainage or tenderness. No mastoid tenderness.     Left Ear: Tympanic membrane, ear canal and external ear normal.     Ears:     Comments: Mild cerumen in right ear canal    Nose: Nose normal.     Mouth/Throat:  Mouth: Mucous membranes are moist.     Pharynx: Oropharynx is clear.  Eyes:     Extraocular Movements: Extraocular movements intact.     Conjunctiva/sclera: Conjunctivae normal.     Pupils: Pupils are equal, round, and reactive to light.  Neck:     Vascular: No carotid bruit.  Cardiovascular:     Rate and Rhythm: Normal rate and regular rhythm.     Heart sounds: Normal heart sounds.  Pulmonary:     Effort: Pulmonary effort is normal.     Breath sounds: Normal breath sounds.  Musculoskeletal:     Cervical back: Normal range of motion and neck supple. No tenderness.  Lymphadenopathy:     Cervical: No cervical adenopathy.  Skin:    General: Skin is warm and dry.     Findings: No rash.  Neurological:     General: No focal deficit present.     Mental Status: She is alert and oriented to person, place, and time.     Cranial Nerves: No cranial  nerve deficit.     Sensory: No sensory deficit.     Motor: No weakness.     Coordination: Coordination normal.     Gait: Gait normal.  Psychiatric:        Mood and Affect: Mood normal.        Behavior: Behavior normal.        Thought Content: Thought content normal.     BP 108/72 (BP Location: Left Arm, Patient Position: Sitting, Cuff Size: Large)   Pulse 60   Temp 97.6 F (36.4 C) (Temporal)   Ht  (1.6 m)   Wt 154 lb (69.9 kg)   SpO2 99%   BMI 27.28 kg/m  Wt Readings from Last 3 Encounters:  12/09/22 154 lb (69.9 kg)  07/30/22 155 lb (70.3 kg)  07/07/22 157 lb 2 oz (71.3 kg)       Assessment & Plan:   Problem List Items Addressed This Visit       Other   Dysuria - Primary   Relevant Orders   POCT Urinalysis Dipstick (Automated) (Completed)   Urine Culture   Other Visit Diagnoses     Right-sided tinnitus          Discussed possible etiologies for the whooshing sound in her right ear.  We discussed the possibility of vascular etiology but unlikely since her symptoms have completely resolved.  She is low risk for cardiovascular disease.  She had more cerumen on the right today and this may have been the cause for her symptoms.  Her exam is benign. If her symptoms return, she will let me know and we will consider imaging versus ENT referral. Urinalysis dipstick negative for infection.  I will send this for culture since she has a history of UTIs and treat accordingly.    I have discontinued Wreatha Pistilli "Kee"'s nitrofurantoin (macrocrystal-monohydrate). I am also having her maintain her Multiple Vitamins-Minerals (ZINC PO) and cetirizine.  No orders of the defined types were placed in this encounter.

## 2022-12-11 LAB — URINE CULTURE

## 2022-12-13 ENCOUNTER — Other Ambulatory Visit: Payer: Self-pay | Admitting: Family Medicine

## 2022-12-13 MED ORDER — NITROFURANTOIN MONOHYD MACRO 100 MG PO CAPS: 100.0000 mg | ORAL_CAPSULE | Freq: Two times a day (BID) | ORAL | 0 refills | Status: DC

## 2022-12-13 NOTE — Progress Notes (Signed)
Please let her know the following:  Your urine culture shows that you have a UTI. I sent in an antibiotic to treat this. Follow up if any concerns.

## 2023-02-03 ENCOUNTER — Telehealth: Payer: BC Managed Care – PPO | Admitting: Physician Assistant

## 2023-02-03 DIAGNOSIS — R3989 Other symptoms and signs involving the genitourinary system: Secondary | ICD-10-CM

## 2023-02-03 MED ORDER — NITROFURANTOIN MONOHYD MACRO 100 MG PO CAPS
100.0000 mg | ORAL_CAPSULE | Freq: Two times a day (BID) | ORAL | 0 refills | Status: DC
Start: 2023-02-03 — End: 2023-04-12

## 2023-02-03 NOTE — Progress Notes (Signed)
E-Visit for Urinary Problems  We are sorry that you are not feeling well.  Here is how we plan to help!  Based on what you shared with me it looks like you most likely have a simple urinary tract infection.  A UTI (Urinary Tract Infection) is a bacterial infection of the bladder.  Most cases of urinary tract infections are simple to treat but a key part of your care is to encourage you to drink plenty of fluids and watch your symptoms carefully.  I have prescribed MacroBid 100 mg twice a day for 5 days.  Your symptoms should gradually improve. Call us if the burning in your urine worsens, you develop worsening fever, back pain or pelvic pain or if your symptoms do not resolve after completing the antibiotic.  Urinary tract infections can be prevented by drinking plenty of water to keep your body hydrated.  Also be sure when you wipe, wipe from front to back and don't hold it in!  If possible, empty your bladder every 4 hours.  HOME CARE Drink plenty of fluids Compete the full course of the antibiotics even if the symptoms resolve Remember, when you need to go.go. Holding in your urine can increase the likelihood of getting a UTI! GET HELP RIGHT AWAY IF: You cannot urinate You get a high fever Worsening back pain occurs You see blood in your urine You feel sick to your stomach or throw up You feel like you are going to pass out  MAKE SURE YOU  Understand these instructions. Will watch your condition. Will get help right away if you are not doing well or get worse.   Thank you for choosing an e-visit.  Your e-visit answers were reviewed by a board certified advanced clinical practitioner to complete your personal care plan. Depending upon the condition, your plan could have included both over the counter or prescription medications.  Please review your pharmacy choice. Make sure the pharmacy is open so you can pick up prescription now. If there is a problem, you may contact your  provider through MyChart messaging and have the prescription routed to another pharmacy.  Your safety is important to us. If you have drug allergies check your prescription carefully.   For the next 24 hours you can use MyChart to ask questions about today's visit, request a non-urgent call back, or ask for a work or school excuse. You will get an email in the next two days asking about your experience. I hope that your e-visit has been valuable and will speed your recovery.  I have spent 5 minutes in review of e-visit questionnaire, review and updating patient chart, medical decision making and response to patient.   Letesha Klecker M Warzecha, PA-C  

## 2023-02-15 DIAGNOSIS — N3020 Other chronic cystitis without hematuria: Secondary | ICD-10-CM | POA: Diagnosis not present

## 2023-03-16 ENCOUNTER — Encounter (INDEPENDENT_AMBULATORY_CARE_PROVIDER_SITE_OTHER): Payer: Self-pay

## 2023-04-08 ENCOUNTER — Encounter: Payer: BC Managed Care – PPO | Admitting: Family Medicine

## 2023-04-12 ENCOUNTER — Ambulatory Visit (INDEPENDENT_AMBULATORY_CARE_PROVIDER_SITE_OTHER): Payer: BC Managed Care – PPO | Admitting: Family Medicine

## 2023-04-12 ENCOUNTER — Encounter: Payer: Self-pay | Admitting: Family Medicine

## 2023-04-12 VITALS — BP 106/74 | HR 66 | Temp 97.8°F | Ht 63.0 in | Wt 156.0 lb

## 2023-04-12 DIAGNOSIS — Z13220 Encounter for screening for lipoid disorders: Secondary | ICD-10-CM | POA: Diagnosis not present

## 2023-04-12 DIAGNOSIS — Z23 Encounter for immunization: Secondary | ICD-10-CM

## 2023-04-12 DIAGNOSIS — Z0001 Encounter for general adult medical examination with abnormal findings: Secondary | ICD-10-CM | POA: Diagnosis not present

## 2023-04-12 DIAGNOSIS — Z83719 Family history of colon polyps, unspecified: Secondary | ICD-10-CM | POA: Diagnosis not present

## 2023-04-12 DIAGNOSIS — R079 Chest pain, unspecified: Secondary | ICD-10-CM | POA: Insufficient documentation

## 2023-04-12 DIAGNOSIS — Z1211 Encounter for screening for malignant neoplasm of colon: Secondary | ICD-10-CM

## 2023-04-12 DIAGNOSIS — Z1159 Encounter for screening for other viral diseases: Secondary | ICD-10-CM | POA: Diagnosis not present

## 2023-04-12 DIAGNOSIS — R5383 Other fatigue: Secondary | ICD-10-CM

## 2023-04-12 DIAGNOSIS — E559 Vitamin D deficiency, unspecified: Secondary | ICD-10-CM | POA: Diagnosis not present

## 2023-04-12 DIAGNOSIS — Z87440 Personal history of urinary (tract) infections: Secondary | ICD-10-CM | POA: Diagnosis not present

## 2023-04-12 LAB — POCT URINALYSIS DIPSTICK
Bilirubin, UA: NEGATIVE
Glucose, UA: NEGATIVE
Ketones, UA: NEGATIVE
Nitrite, UA: NEGATIVE
Protein, UA: NEGATIVE
Spec Grav, UA: 1.015 (ref 1.010–1.025)
Urobilinogen, UA: 0.2 E.U./dL
pH, UA: 6.5 (ref 5.0–8.0)

## 2023-04-12 LAB — COMPREHENSIVE METABOLIC PANEL
ALT: 11 U/L (ref 0–35)
AST: 16 U/L (ref 0–37)
Albumin: 4 g/dL (ref 3.5–5.2)
Alkaline Phosphatase: 66 U/L (ref 39–117)
BUN: 13 mg/dL (ref 6–23)
CO2: 29 mEq/L (ref 19–32)
Calcium: 9.1 mg/dL (ref 8.4–10.5)
Chloride: 101 mEq/L (ref 96–112)
Creatinine, Ser: 0.96 mg/dL (ref 0.40–1.20)
GFR: 71.17 mL/min (ref >60.00)
Glucose, Bld: 92 mg/dL (ref 70–99)
Potassium: 4 mEq/L (ref 3.5–5.1)
Sodium: 138 mEq/L (ref 135–145)
Total Bilirubin: 0.5 mg/dL (ref 0.2–1.2)
Total Protein: 7.1 g/dL (ref 6.0–8.3)

## 2023-04-12 LAB — TSH: TSH: 2.05 u[IU]/mL (ref 0.35–5.50)

## 2023-04-12 LAB — CBC WITH DIFFERENTIAL/PLATELET
Basophils Absolute: 0 10*3/uL (ref 0.0–0.1)
Basophils Relative: 0.9 % (ref 0.0–3.0)
Eosinophils Absolute: 0.1 10*3/uL (ref 0.0–0.7)
Eosinophils Relative: 2.6 % (ref 0.0–5.0)
HCT: 34.6 % — ABNORMAL LOW (ref 36.0–46.0)
Hemoglobin: 11.1 g/dL — ABNORMAL LOW (ref 12.0–15.0)
Lymphocytes Relative: 34.7 % (ref 12.0–46.0)
Lymphs Abs: 1.5 10*3/uL (ref 0.7–4.0)
MCHC: 32.2 g/dL (ref 30.0–36.0)
MCV: 83.9 fl (ref 78.0–100.0)
Monocytes Absolute: 0.3 10*3/uL (ref 0.1–1.0)
Monocytes Relative: 7.2 % (ref 3.0–12.0)
Neutro Abs: 2.4 10*3/uL (ref 1.4–7.7)
Neutrophils Relative %: 54.6 % (ref 43.0–77.0)
Platelets: 296 10*3/uL (ref 150.0–400.0)
RBC: 4.12 Mil/uL (ref 3.87–5.11)
RDW: 14.5 % (ref 11.5–15.5)
WBC: 4.3 10*3/uL (ref 4.0–10.5)

## 2023-04-12 LAB — T4, FREE: Free T4: 0.94 ng/dL (ref 0.60–1.60)

## 2023-04-12 LAB — TROPONIN I (HIGH SENSITIVITY): High Sens Troponin I: 3 ng/L (ref 2–17)

## 2023-04-12 LAB — FOLATE: Folate: 17.2 ng/mL (ref >5.9)

## 2023-04-12 LAB — LIPID PANEL
Cholesterol: 216 mg/dL — ABNORMAL HIGH (ref 0–200)
HDL: 48.2 mg/dL (ref >39.00)
LDL Cholesterol: 150 mg/dL — ABNORMAL HIGH (ref 0–99)
NonHDL: 167.44
Total CHOL/HDL Ratio: 4
Triglycerides: 87 mg/dL (ref 0.0–149.0)
VLDL: 17.4 mg/dL (ref 0.0–40.0)

## 2023-04-12 LAB — VITAMIN D 25 HYDROXY (VIT D DEFICIENCY, FRACTURES): VITD: 47.62 ng/mL (ref 30.00–100.00)

## 2023-04-12 LAB — VITAMIN B12: Vitamin B-12: 253 pg/mL (ref 211–911)

## 2023-04-12 NOTE — Assessment & Plan Note (Signed)
2 hour episode yesterday. No associated symptoms.  EKG shows NSR, rate 59, no Q waves or ST-T wave changes.  Troponin ordered. No sign of PE, DVT or ACS. Euvolemic.  Check lipids and other labs.  Follow up if chest pain returns.

## 2023-04-12 NOTE — Assessment & Plan Note (Signed)
Tdap given. She will get flu vaccine later in the month or next month.

## 2023-04-12 NOTE — Assessment & Plan Note (Signed)
 Preventive health care reviewed.  Sees OB/GYN. Counseling on healthy lifestyle including diet and exercise.  Recommend regular dental and eye exams.  Immunizations reviewed.  Discussed safety.

## 2023-04-12 NOTE — Assessment & Plan Note (Signed)
Busy work schedule and mother of 2. Check labs to look for underlying etiology.

## 2023-04-12 NOTE — Assessment & Plan Note (Signed)
Referral to GI for her first screening colonoscopy

## 2023-04-12 NOTE — Progress Notes (Signed)
Complete physical exam  Patient: Kathy Rose   DOB: 1976-05-29   47 y.o. Female  MRN: [id number]  Subjective:    Chief Complaint  Patient presents with   Annual Exam    fasting   She is here for a complete physical exam.  Sees OB/GYN  States she had dull right sided chest pain yesterday. Lasted for 2 hours. States it was triggered by stress at work. Pain did not radiate. No pain since. Denies dizziness, shortness of breath, palpitations, N/V.   Hx of chest pain in college and had a cardiology work up which was negative but that was 24 years ago.   Denies family hx of heart disease.   Mother with colon polyps   She saw a urologist for recurrent UTIs.    Health Maintenance  Topic Date Due   HIV Screening  Never done   Hepatitis C Screening  Never done   Colon Cancer Screening  Never done   Flu Shot  03/17/2023   COVID-19 Vaccine (1) 04/28/2023*   Mammogram  10/21/2023   Pap Smear  02/11/2025   DTaP/Tdap/Td vaccine (3 - Td or Tdap) 04/11/2033   HPV Vaccine  Aged Out  *Topic was postponed. The date shown is not the original due date.    Wears seatbelt always, uses sunscreen, smoke detectors in home and functioning, does not text while driving, feels safe in home environment.  Depression screening:    04/12/2023    8:30 AM 12/09/2022    8:50 AM 07/07/2022    1:35 PM  Depression screen PHQ 2/9  Decreased Interest 0 0 0  Down, Depressed, Hopeless 0 0 0  PHQ - 2 Score 0 0 0   Anxiety Screening:     No data to display          Vision:Within last year and Dental: No current dental problems and Receives regular dental care  Patient Active Problem List   Diagnosis Date Noted   Family history of colonic polyps 04/12/2023   Chest pain 04/12/2023   Fatigue 04/12/2023   Encounter for general adult medical examination with abnormal findings 04/12/2023   Immunization due 04/12/2023   Clinical infection 07/07/2022   Dysuria 07/07/2022   Cold sore 04/01/2022    Frequent UTI 04/01/2022   Easy bruising 04/01/2022   Vitamin D deficiency 04/01/2022   Hx of iron deficiency anemia 04/01/2022   PCB (post coital bleeding) 04/01/2022   Frequent menstruation 08/03/2013   History reviewed. No pertinent past medical history. Past Surgical History:  Procedure Laterality Date   TUBAL LIGATION     Social History   Tobacco Use   Smoking status: Never   Smokeless tobacco: Never  Substance Use Topics   Alcohol use: No   Drug use: No      Patient Care Team: Avanell Shackleton, NP-C as PCP - General (Family Medicine)   Outpatient Medications Prior to Visit  Medication Sig   cetirizine (ZYRTEC) 10 MG chewable tablet Chew 10 mg by mouth daily.   Multiple Vitamins-Minerals (ZINC PO) Take by mouth.   [DISCONTINUED] nitrofurantoin, macrocrystal-monohydrate, (MACROBID) 100 MG capsule Take 1 capsule (100 mg total) by mouth 2 (two) times daily.   No facility-administered medications prior to visit.    Review of Systems  Constitutional:  Positive for malaise/fatigue. Negative for chills and fever.  HENT:  Negative for congestion, ear pain, sinus pain and sore throat.   Eyes:  Negative for blurred vision, double vision and pain.  Respiratory:  Negative for cough, shortness of breath and wheezing.   Cardiovascular:  Positive for chest pain. Negative for palpitations, orthopnea, claudication and leg swelling.  Gastrointestinal:  Negative for abdominal pain, constipation, diarrhea, nausea and vomiting.  Genitourinary:  Negative for dysuria, frequency and urgency.  Musculoskeletal:  Negative for back pain, joint pain and myalgias.  Skin:  Negative for rash.  Neurological:  Negative for dizziness, tingling, focal weakness and headaches.  Endo/Heme/Allergies:  Does not bruise/bleed easily.  Psychiatric/Behavioral:  Negative for depression and suicidal ideas. The patient is not nervous/anxious.        Objective:    BP 106/74 (BP Location: Left Arm, Patient  Position: Sitting, Cuff Size: Large)   Pulse 66   Temp 97.8 F (36.6 C) (Temporal)   Ht 5\' 3"  (1.6 m)   Wt 156 lb (70.8 kg)   LMP 04/07/2023   SpO2 97%   BMI 27.63 kg/m  BP Readings from Last 3 Encounters:  04/12/23 106/74  12/09/22 108/72  07/30/22 124/70   Wt Readings from Last 3 Encounters:  04/12/23 156 lb (70.8 kg)  12/09/22 154 lb (69.9 kg)  07/30/22 155 lb (70.3 kg)    Physical Exam Constitutional:      General: She is not in acute distress.    Appearance: She is not ill-appearing.  HENT:     Right Ear: Tympanic membrane, ear canal and external ear normal.     Left Ear: Tympanic membrane, ear canal and external ear normal.     Nose: Nose normal.     Mouth/Throat:     Mouth: Mucous membranes are moist.     Pharynx: Oropharynx is clear.  Eyes:     Extraocular Movements: Extraocular movements intact.     Conjunctiva/sclera: Conjunctivae normal.     Pupils: Pupils are equal, round, and reactive to light.  Neck:     Thyroid: No thyroid mass, thyromegaly or thyroid tenderness.  Cardiovascular:     Rate and Rhythm: Normal rate and regular rhythm.     Pulses: Normal pulses.     Heart sounds: Normal heart sounds.  Pulmonary:     Effort: Pulmonary effort is normal.     Breath sounds: Normal breath sounds.  Abdominal:     General: Bowel sounds are normal.     Palpations: Abdomen is soft.     Tenderness: There is no abdominal tenderness. There is no right CVA tenderness, left CVA tenderness, guarding or rebound.  Musculoskeletal:        General: Normal range of motion.     Cervical back: Normal range of motion and neck supple. No tenderness.     Right lower leg: No edema.     Left lower leg: No edema.  Lymphadenopathy:     Cervical: No cervical adenopathy.  Skin:    General: Skin is warm and dry.     Findings: No lesion or rash.  Neurological:     General: No focal deficit present.     Mental Status: She is alert and oriented to person, place, and time.      Cranial Nerves: No cranial nerve deficit.     Sensory: No sensory deficit.     Motor: No weakness.     Gait: Gait normal.  Psychiatric:        Mood and Affect: Mood normal.        Behavior: Behavior normal.        Thought Content: Thought content normal.  Results for orders placed or performed in visit on 04/12/23  POCT urinalysis dipstick  Result Value Ref Range   Color, UA yellow    Clarity, UA clear    Glucose, UA Negative Negative   Bilirubin, UA negative    Ketones, UA negative    Spec Grav, UA 1.015 1.010 - 1.025   Blood, UA trace    pH, UA 6.5 5.0 - 8.0   Protein, UA Negative Negative   Urobilinogen, UA 0.2 0.2 or 1.0 E.U./dL   Nitrite, UA negative    Leukocytes, UA Small (1+) (A) Negative   Appearance     Odor        Assessment & Plan:    Routine Health Maintenance and Physical Exam  Problem List Items Addressed This Visit       Other   Chest pain    2 hour episode yesterday. No associated symptoms.  EKG shows NSR, rate 59, no Q waves or ST-T wave changes.  Troponin ordered. No sign of PE, DVT or ACS. Euvolemic.  Check lipids and other labs.  Follow up if chest pain returns.       Relevant Orders   Troponin I (High Sensitivity)   EKG 12-Lead   Encounter for general adult medical examination with abnormal findings - Primary    Preventive health care reviewed.  Sees OB/GYN. Counseling on healthy lifestyle including diet and exercise.  Recommend regular dental and eye exams.  Immunizations reviewed.  Discussed safety.       Family history of colonic polyps    Referral to GI for her first screening colonoscopy      Relevant Orders   Ambulatory referral to Gastroenterology   Fatigue    Busy work schedule and mother of 2. Check labs to look for underlying etiology.       Relevant Orders   CBC with Differential/Platelet   Comprehensive metabolic panel   TSH   T4, free   Vitamin B12   Folate   Immunization due    Tdap given. She will get flu  vaccine later in the month or next month.       Relevant Orders   Tdap vaccine greater than or equal to 7yo IM (Completed)   Vitamin D deficiency    Check vitamin D level and replace as needed      Relevant Orders   VITAMIN D 25 Hydroxy (Vit-D Deficiency, Fractures)   Other Visit Diagnoses     Screen for colon cancer       Relevant Orders   Ambulatory referral to Gastroenterology   Encounter for screening for other viral diseases       Relevant Orders   Hepatitis C antibody   HIV Antibody (routine testing w rflx)   History of recurrent UTI (urinary tract infection)       Relevant Orders   POCT urinalysis dipstick (Completed)   Screening for lipid disorders       Relevant Orders   Lipid panel       Return in about 1 year (around 04/11/2024).     Hetty Blend, NP-C

## 2023-04-12 NOTE — Assessment & Plan Note (Signed)
Check vitamin D level and replace as needed.  

## 2023-04-12 NOTE — Patient Instructions (Signed)
Please follow up with your urologist.

## 2023-04-13 LAB — HEPATITIS C ANTIBODY: Hepatitis C Ab: NONREACTIVE

## 2023-04-13 LAB — HIV ANTIBODY (ROUTINE TESTING W REFLEX): HIV 1&2 Ab, 4th Generation: NONREACTIVE

## 2023-05-03 DIAGNOSIS — N390 Urinary tract infection, site not specified: Secondary | ICD-10-CM | POA: Diagnosis not present

## 2023-05-03 DIAGNOSIS — N3020 Other chronic cystitis without hematuria: Secondary | ICD-10-CM | POA: Diagnosis not present

## 2023-05-03 DIAGNOSIS — B9620 Unspecified Escherichia coli [E. coli] as the cause of diseases classified elsewhere: Secondary | ICD-10-CM | POA: Diagnosis not present

## 2023-05-18 DIAGNOSIS — N3000 Acute cystitis without hematuria: Secondary | ICD-10-CM | POA: Diagnosis not present

## 2023-05-18 DIAGNOSIS — N390 Urinary tract infection, site not specified: Secondary | ICD-10-CM | POA: Diagnosis not present

## 2023-05-18 DIAGNOSIS — N3020 Other chronic cystitis without hematuria: Secondary | ICD-10-CM | POA: Diagnosis not present

## 2023-05-18 DIAGNOSIS — B9620 Unspecified Escherichia coli [E. coli] as the cause of diseases classified elsewhere: Secondary | ICD-10-CM | POA: Diagnosis not present

## 2023-09-17 ENCOUNTER — Telehealth: Payer: BC Managed Care – PPO

## 2023-09-18 ENCOUNTER — Telehealth: Payer: BC Managed Care – PPO | Admitting: Nurse Practitioner

## 2023-09-18 DIAGNOSIS — J069 Acute upper respiratory infection, unspecified: Secondary | ICD-10-CM | POA: Diagnosis not present

## 2023-09-18 MED ORDER — PSEUDOEPH-BROMPHEN-DM 30-2-10 MG/5ML PO SYRP
5.0000 mL | ORAL_SOLUTION | Freq: Four times a day (QID) | ORAL | 0 refills | Status: AC | PRN
Start: 2023-09-18 — End: ?

## 2023-09-18 MED ORDER — IPRATROPIUM BROMIDE 0.03 % NA SOLN
2.0000 | Freq: Two times a day (BID) | NASAL | 12 refills | Status: AC
Start: 2023-09-18 — End: ?

## 2023-09-18 NOTE — Progress Notes (Signed)
 I have spent 5 minutes in review of e-visit questionnaire, review and updating patient chart, medical decision making and response to patient.   Claiborne Rigg, NP

## 2023-09-18 NOTE — Progress Notes (Signed)
E-Visit for Upper Respiratory Infection  If you are not feeling better in 4-5 days please feel free to reach back out to [place] or your PCP.    We are sorry you are not feeling well.  Here is how we plan to help!  Providers prescribe antibiotics to treat infections caused by bacteria. Antibiotics are very powerful in treating bacterial infections when they are used properly. To maintain their effectiveness, they should be used only when necessary. Overuse of antibiotics has resulted in the development of superbugs that are resistant to treatment!    After careful review of your answers, I would not recommend an antibiotic for your condition.  Antibiotics are not effective against viruses and therefore should not be used to treat them. Common examples of infections caused by viruses include colds and flu   Based on what you have shared with me, it looks like you may have a viral upper respiratory infection.  Upper respiratory infections are caused by a large number of viruses; however, rhinovirus is the most common cause.   Symptoms vary from person to person, with common symptoms including sore throat, cough, fatigue or lack of energy and feeling of general discomfort.  A low-grade fever of up to 100.4 may present, but is often uncommon.  Symptoms vary however, and are closely related to a person's age or underlying illnesses.  The most common symptoms associated with an upper respiratory infection are nasal discharge or congestion, cough, sneezing, headache and pressure in the ears and face.  These symptoms usually persist for about 3 to 10 days, but can last up to 2 weeks.  It is important to know that upper respiratory infections do not cause serious illness or complications in most cases.    Upper respiratory infections can be transmitted from person to person, with the most common method of transmission being a person's hands.  The virus is able to live on the skin and can infect other persons for up  to 2 hours after direct contact.  Also, these can be transmitted when someone coughs or sneezes; thus, it is important to cover the mouth to reduce this risk.  To keep the spread of the illness at bay, good hand hygiene is very important.  This is an infection that is most likely caused by a virus. There are no specific treatments other than to help you with the symptoms until the infection runs its course.  We are sorry you are not feeling well.  Here is how we plan to help!   For nasal congestion, you may use an oral decongestants such as Mucinex D or if you have glaucoma or high blood pressure use plain Mucinex.  Saline nasal spray or nasal drops can help and can safely be used as often as needed for congestion.  For your congestion, I have prescribed Ipratropium Bromide nasal spray 0.03% two sprays in each nostril 2-3 times a day  If you do not have a history of heart disease, hypertension, diabetes or thyroid disease, prostate/bladder issues or glaucoma, you may also use Sudafed to treat nasal congestion.  It is highly recommended that you consult with a pharmacist or your primary care physician to ensure this medication is safe for you to take.     If you have a cough, you may use cough suppressants such as Delsym and Robitussin.  If you have glaucoma or high blood pressure, you can also use Coricidin HBP.   For cough I have prescribed for you  a prescription cough syrup.   If you have a sore or scratchy throat, use a saltwater gargle-  to  teaspoon of salt dissolved in a 4-ounce to 8-ounce glass of warm water.  Gargle the solution for approximately 15-30 seconds and then spit.  It is important not to swallow the solution.  You can also use throat lozenges/cough drops and Chloraseptic spray to help with throat pain or discomfort.  Warm or cold liquids can also be helpful in relieving throat pain.  For headache, pain or general discomfort, you can use Ibuprofen or Tylenol as directed.   Some  authorities believe that zinc sprays or the use of Echinacea may shorten the course of your symptoms.   HOME CARE Only take medications as instructed by your medical team. Be sure to drink plenty of fluids. Water is fine as well as fruit juices, sodas and electrolyte beverages. You may want to stay away from caffeine or alcohol. If you are nauseated, try taking small sips of liquids. How do you know if you are getting enough fluid? Your urine should be a pale yellow or almost colorless. Get rest. Taking a steamy shower or using a humidifier may help nasal congestion and ease sore throat pain. You can place a towel over your head and breathe in the steam from hot water coming from a faucet. Using a saline nasal spray works much the same way. Cough drops, hard candies and sore throat lozenges may ease your cough. Avoid close contacts especially the very young and the elderly Cover your mouth if you cough or sneeze Always remember to wash your hands.   GET HELP RIGHT AWAY IF: You develop worsening fever. If your symptoms do not improve within 10 days You develop yellow or green discharge from your nose over 3 days. You have coughing fits You develop a severe head ache or visual changes. You develop shortness of breath, difficulty breathing or start having chest pain Your symptoms persist after you have completed your treatment plan  MAKE SURE YOU  Understand these instructions. Will watch your condition. Will get help right away if you are not doing well or get worse.  Thank you for choosing an e-visit.  Your e-visit answers were reviewed by a board certified advanced clinical practitioner to complete your personal care plan. Depending upon the condition, your plan could have included both over the counter or prescription medications.  Please review your pharmacy choice. Make sure the pharmacy is open so you can pick up prescription now. If there is a problem, you may contact your  provider through [employer] and have the prescription routed to another pharmacy.  Your safety is important to [place]. If you have drug allergies check your prescription carefully.   For the next 24 hours you can use MyChart to ask questions about today's visit, request a non-urgent call back, or ask for a work or school excuse. You will get an email in the next two days asking about your experience. I hope that your e-visit has been valuable and will speed your recovery.

## 2023-09-19 ENCOUNTER — Encounter: Payer: Self-pay | Admitting: Family Medicine

## 2023-09-19 ENCOUNTER — Ambulatory Visit: Payer: BC Managed Care – PPO | Admitting: Family Medicine

## 2023-09-19 ENCOUNTER — Ambulatory Visit: Payer: Self-pay | Admitting: Family Medicine

## 2023-09-19 VITALS — BP 110/62 | HR 85 | Temp 98.4°F | Resp 18 | Ht 63.0 in | Wt 159.0 lb

## 2023-09-19 DIAGNOSIS — J101 Influenza due to other identified influenza virus with other respiratory manifestations: Secondary | ICD-10-CM | POA: Diagnosis not present

## 2023-09-19 DIAGNOSIS — B001 Herpesviral vesicular dermatitis: Secondary | ICD-10-CM | POA: Diagnosis not present

## 2023-09-19 LAB — POCT INFLUENZA A/B
Influenza A, POC: POSITIVE — AB
Influenza B, POC: NEGATIVE

## 2023-09-19 LAB — POC COVID19 BINAXNOW: SARS Coronavirus 2 Ag: NEGATIVE

## 2023-09-19 MED ORDER — VALACYCLOVIR HCL 500 MG PO TABS
2000.0000 mg | ORAL_TABLET | Freq: Two times a day (BID) | ORAL | 0 refills | Status: AC
Start: 2023-09-19 — End: 2023-09-20

## 2023-09-19 NOTE — Patient Instructions (Addendum)
Throat lozenges, chloraseptic spray, warm salt water gargles, hot tea/honey, cough syrup (Delsym), Tylenol/Ibuprofen, Dayquil/Nyquil, or Tylenol Cold & Flu AM/PM, Vicks, and a humidifier at night.

## 2023-09-19 NOTE — Telephone Encounter (Signed)
Copied from CRM [id number]. Topic: Clinical - Red Word Triage >> Sep 19, 2023  8:35 AM Theodis Sato wrote: Red Word that prompted transfer to Nurse Triage: Patient states she woke up with a swollen face (nose/mouth) and a new cold sore.   Chief Complaint: facial swelling, new cold sore around nose that wont heal Symptoms: swelling, non productive cough, congestion, recent upper respiratory infection, yellow mucous now Frequency: this morning Pertinent Negatives: Patient denies difficulty breathing, nausea, vomiting, diarrhea, fever Disposition: [] ED /[] Urgent Care (no appt availability in office) / [x] Appointment(In office/virtual)/ []  [facility]/ [] Home Care/ [] Refused Recommended Disposition /[] [facility]/ []  Follow-up with PCP Additional Notes: Patient called and was diagnosed with you upper respiratory infection with cough and congestion--patient hasn't picked up the prescription yet.  Patient states that she woke up today and her face was swollen and she also has a new cold sore around her nose that is not getting any better.  Swelling around eyes, cheeks, nose, and lips.  Patient states that she took Theraflu and Mucinex within the past 12 hours and has never had an issue with these in the past.  She denies any swelling in her throat or any difficulty breathing or moving air.  She also denies any feer, nausea, vomiting,or diarrhea.  She states that her mucous has started to be yellow since this morning. She denies taking any blood pressure medications such as Lisinopril. She denies any itching or any noted hives.  Appointment made for today 09/19/2023 with Deliah Boston at 11:20am.  Patient is also advised that if anything changes or worsens to go to the emergency room or call 911.  Patient verbalized understanding.  Reason for Disposition  SEVERE swelling of the entire face  Protocols used: Face Swelling-A-AH

## 2023-09-19 NOTE — Progress Notes (Addendum)
Assessment & Plan:  1. Influenza A (Primary) Education provided on influenza.  Discussed typical duration and progression of viral illnesses.  Encouraged symptom management including throat lozenges, chloraseptic spray, warm salt water gargles, hot tea/honey, cough syrup (Delsym), Tylenol/Ibuprofen, Dayquil/Nyquil, or Tylenol Cold & Flu AM/PM, Vicks, and a humidifier at night.  - POC COVID-19 BinaxNow - POCT Influenza A/B  2. Herpes labialis - valACYclovir (VALTREX) 500 MG tablet; Take 4 tablets (2,000 mg total) by mouth 2 (two) times daily for 1 day.  Dispense: 8 tablet; Refill: 0  Results for orders placed or performed in visit on 09/19/23  POC COVID-19 BinaxNow  Result Value Ref Range   SARS Coronavirus 2 Ag Negative Negative  POCT Influenza A/B  Result Value Ref Range   Influenza A, POC Positive (A) Negative   Influenza B, POC Negative Negative    Follow up plan: Return if symptoms worsen or fail to improve.  Deliah Boston, MSN, APRN, FNP-C  Subjective:  HPI: Kathy Rose is a 48 y.o. female presenting on 09/19/2023 for URI (Tired, runny nose, chills, cough, right ear pain, gums swollen, facial swelling - S/S started on Friday around lunch /Done a e-visit on yesterday - stated it was viral and gave 2 meds that she has been unable to pick up yet due to pharm being closed.)  Patient complains of cough, runny nose, ear pain/pressure, facial pain/pressure, fatigue, chills, and gum swollen. She denies fever and wheezing. Onset of symptoms was 3 days ago, gradually worsening since that time. She is drinking plenty of fluids. Evaluation to date: previously completed an E-Visit and was thought to have a viral URI. Treatment to date:  Theraflu, Mucinex 12H .  She does not smoke.     ROS: Negative unless specifically indicated above in HPI.   Relevant past medical history reviewed and updated as indicated.   Allergies and medications reviewed and updated.   Current Outpatient  Medications:    cetirizine (ZYRTEC) 10 MG chewable tablet, Chew 10 mg by mouth daily., Disp: , Rfl:    Multiple Vitamins-Minerals (ZINC PO), Take by mouth., Disp: , Rfl:    brompheniramine-pseudoephedrine-DM 30-2-10 MG/5ML syrup, Take 5 mLs by mouth 4 (four) times daily as needed. (Patient not taking: Reported on 09/19/2023), Disp: 240 mL, Rfl: 0   ipratropium (ATROVENT) 0.03 % nasal spray, Place 2 sprays into both nostrils every 12 (twelve) hours. (Patient not taking: Reported on 09/19/2023), Disp: 30 mL, Rfl: 12  Allergies  Allergen Reactions   Albuterol     Patient indicates this made her tongue feel "funny"   Claritin [Loratadine]     Objective:   BP 110/62   Pulse 85   Temp 98.4 F (36.9 C)   Resp 18   Ht 5\' 3"  (1.6 m)   Wt 159 lb (72.1 kg)   LMP 09/05/2023 (Approximate)   SpO2 99%   BMI 28.17 kg/m    Physical Exam Vitals reviewed.  Constitutional:      General: She is not in acute distress.    Appearance: Normal appearance. She is not ill-appearing, toxic-appearing or diaphoretic.  HENT:     Head: Normocephalic and atraumatic.     Right Ear: Ear canal and external ear normal. There is no impacted cerumen. Tympanic membrane is erythematous and bulging.     Left Ear: Ear canal and external ear normal. There is no impacted cerumen. Tympanic membrane is erythematous and bulging.     Nose: Nose normal. No congestion or rhinorrhea.  Right Sinus: No maxillary sinus tenderness or frontal sinus tenderness.     Left Sinus: No maxillary sinus tenderness or frontal sinus tenderness.     Mouth/Throat:     Lips: Lesions (cold sores) present.     Mouth: Mucous membranes are moist.     Pharynx: Oropharynx is clear. Posterior oropharyngeal erythema present. No oropharyngeal exudate.  Eyes:     General: No scleral icterus.       Right eye: No discharge.        Left eye: No discharge.     Conjunctiva/sclera: Conjunctivae normal.  Cardiovascular:     Rate and Rhythm: Normal rate and  regular rhythm.     Heart sounds: Normal heart sounds. No murmur heard.    No friction rub. No gallop.  Pulmonary:     Effort: Pulmonary effort is normal. No respiratory distress.     Breath sounds: Normal breath sounds. No stridor. No wheezing, rhonchi or rales.  Musculoskeletal:        General: Normal range of motion.     Cervical back: Normal range of motion.  Lymphadenopathy:     Cervical: No cervical adenopathy.  Skin:    General: Skin is warm and dry.     Capillary Refill: Capillary refill takes less than 2 seconds.  Neurological:     General: No focal deficit present.     Mental Status: She is alert and oriented to person, place, and time. Mental status is at baseline.  Psychiatric:        Mood and Affect: Mood normal.        Behavior: Behavior normal.        Thought Content: Thought content normal.        Judgment: Judgment normal.

## 2023-09-24 ENCOUNTER — Encounter: Payer: Self-pay | Admitting: Family Medicine

## 2023-09-24 DIAGNOSIS — B001 Herpesviral vesicular dermatitis: Secondary | ICD-10-CM

## 2023-09-26 MED ORDER — VALACYCLOVIR HCL 500 MG PO TABS: 2000.0000 mg | ORAL_TABLET | Freq: Two times a day (BID) | ORAL | 0 refills | Status: AC

## 2023-11-04 DIAGNOSIS — Z1231 Encounter for screening mammogram for malignant neoplasm of breast: Secondary | ICD-10-CM | POA: Diagnosis not present

## 2023-11-04 LAB — HM MAMMOGRAPHY

## 2023-11-16 ENCOUNTER — Encounter: Payer: Self-pay | Admitting: Family Medicine

## 2023-11-18 ENCOUNTER — Telehealth: Payer: Self-pay | Admitting: Nurse Practitioner

## 2023-11-18 ENCOUNTER — Ambulatory Visit: Admitting: Nurse Practitioner

## 2023-11-18 ENCOUNTER — Ambulatory Visit (INDEPENDENT_AMBULATORY_CARE_PROVIDER_SITE_OTHER)

## 2023-11-18 VITALS — BP 120/82 | HR 63 | Temp 98.1°F | Ht 63.0 in | Wt 159.4 lb

## 2023-11-18 DIAGNOSIS — M25551 Pain in right hip: Secondary | ICD-10-CM

## 2023-11-18 MED ORDER — MELOXICAM 7.5 MG PO TABS: 7.5000 mg | ORAL_TABLET | Freq: Every day | ORAL | 0 refills | Status: AC

## 2023-11-18 NOTE — Telephone Encounter (Signed)
 Please call patient let her know that hip x-ray did not identify any evidence of fracture.  Recommend treatment with meloxicam once a day x 2 weeks, rest as needed, ice or heat, and over-the-counter Tylenol if additional pain management as needed.  If she would like evaluation with specialist at this time let me know and I can order referral but otherwise I would recommend conservative treatment as discussed above and referral if symptoms do not improve.  If symptoms do not improve or they worsen she can return to clinic/proceed to the emergency department.

## 2023-11-18 NOTE — Assessment & Plan Note (Signed)
 Acute on chronic Overall symptoms have improved since they started earlier this week.  However am concerned that pain occurs mainly with weightbearing.  Will get stat x-ray today.  Treat with meloxicam 7.5 mg daily x 2 weeks.  Pending x-ray results and if symptoms do not improve may consider referral to sports medicine or orthopedic surgery. Patient educated to proceed to the emergency department if she experiences sensory changes, weakness, increase in severity of pain, or bowel/bladder incontinence.

## 2023-11-18 NOTE — Addendum Note (Signed)
 Addended by: Elenore Paddy on: 11/18/2023 05:56 PM   Modules accepted: Orders

## 2023-11-18 NOTE — Progress Notes (Signed)
 Established Patient Office Visit  Subjective   Patient ID: Kathy Rose, female    DOB: 09-16-1975  Age: 48 y.o. MRN: [id number]  Chief Complaint  Patient presents with   right hip pain    Patient is today for acute visit for above. Reports she has chronic intermittent right hip pain.  She reports today that symptoms started about 3 days ago, and the pain is most severe when she is bearing weight.  When she is at rest she has little to no pain.  Initially pain was 9-10 out of 10 intensity and described it as a stabbing pain with ambulation.  Currently it is 2-3/10 in intensity.  She has taken over-the-counter Tylenol/ibuprofen with some relief.  She denies any recent trauma to the area.  She is not a runner.  No new/changes to activity level.  Denies numbness, weakness, bowel or bladder incontinence.    ROS: see HPI    Objective:     BP 120/82   Pulse 63   Temp 98.1 F (36.7 C) (Temporal)   Ht 5\' 3"  (1.6 m)   Wt 159 lb 6 oz (72.3 kg)   LMP 11/03/2023   SpO2 99%   BMI 28.23 kg/m    Physical Exam Vitals reviewed.  Constitutional:      General: She is not in acute distress.    Appearance: Normal appearance.  HENT:     Head: Normocephalic and atraumatic.  Neck:     Vascular: No carotid bruit.  Cardiovascular:     Rate and Rhythm: Normal rate and regular rhythm.     Pulses: Normal pulses.     Heart sounds: Normal heart sounds.  Pulmonary:     Effort: Pulmonary effort is normal.     Breath sounds: Normal breath sounds.  Musculoskeletal:     Right hip: No deformity, tenderness or bony tenderness. Normal range of motion. Normal strength.  Skin:    General: Skin is warm and dry.  Neurological:     General: No focal deficit present.     Mental Status: She is alert and oriented to person, place, and time.     Sensory: Sensation is intact.  Psychiatric:        Mood and Affect: Mood normal.        Behavior: Behavior normal.        Judgment: Judgment normal.       No results found for any visits on 11/18/23.    The 10-year ASCVD risk score (Arnett DK, et al., 2019) is: 1.1%    Assessment & Plan:   Problem List Items Addressed This Visit       Other   Right hip pain - Primary   Acute on chronic Overall symptoms have improved since they started earlier this week.  However am concerned that pain occurs mainly with weightbearing.  Will get stat x-ray today.  Treat with meloxicam 7.5 mg daily x 2 weeks.  Pending x-ray results and if symptoms do not improve may consider referral to sports medicine or orthopedic surgery. Patient educated to proceed to the emergency department if she experiences sensory changes, weakness, increase in severity of pain, or bowel/bladder incontinence.      Relevant Medications   meloxicam (MOBIC) 7.5 MG tablet   Other Relevant Orders   DG HIP UNILAT W OR W/O PELVIS 2-3 VIEWS RIGHT (Completed)    Return in about 2 weeks (around 12/02/2023) for F/U with PCP or [facility].    Elenore Paddy,  NP

## 2023-11-18 NOTE — Telephone Encounter (Signed)
 Pt aware and verbalized understanding, also is ok with having a referral ordered.

## 2023-11-23 NOTE — Progress Notes (Unsigned)
  I, Rolland Bimler am a scribe for Dr. Denyse Amass.    Kathy Rose is a 48 y.o. female who presents to [facility] today for R hip pain ongoing for about a wk. Pt locates pain to r hip. About a week and a half ago, so bad that she was struggling to get up her stairs. It is internal. It hurts when she puts pressure on it and not on the outside. Once she lays down to go to bed it is fine. The problem is when she is standing. Has not tried any topicals.  Pain is located primarily in the anterior hip worse with standing and activity.  Radiates: no Aggravates: any weight bearing activities Treatments tried: Tylenol, IBU, meloxicam  Dx imaging: 11/18/23 R hip XR  Pertinent review of systems: No fevers or chills  Relevant historical information: History of iron deficiency anemia.   Exam:  BP 110/60   Pulse 75   Ht 5\' 3"  (1.6 m)   Wt 162 lb (73.5 kg)   LMP 11/03/2023   SpO2 98%   BMI 28.70 kg/m  General: Well Developed, well nourished, and in no acute distress.   MSK: Right hip normal-appearing. Normal motion. Intact strength. Unable to reproduce pain.    Lab and Radiology Results  DG HIP UNILAT W OR W/O PELVIS 2-3 VIEWS RIGHT Result Date: 11/18/2023 CLINICAL DATA:  Right hip pain for several months, worsening for 2 days EXAM: DG HIP (WITH OR WITHOUT PELVIS) 2-3V RIGHT COMPARISON:  None Available. FINDINGS: Frontal view of the pelvis as well as frontal and frogleg lateral views of the right hip are obtained. No acute fracture, subluxation, or dislocation. Joint spaces are well preserved. Sacroiliac joints are normal. Soft tissues are unremarkable. IMPRESSION: 1. Unremarkable pelvis and right hip. Electronically Signed   By: Sharlet Salina M.D.   On: 11/18/2023 14:56   I, Clementeen Graham, personally (independently) visualized and performed the interpretation of the images attached in this note.     Assessment and Plan: 48 y.o. female with right hip  pain.  Pain is anterior hip which would typically be due to intra-articular etiology such as arthritis or labrum tear or avascular necrosis.  X-ray obtained on April 4 is benign appearing.  Note unable to reproduce her pain with physical exam maneuvers.  Plan for trial of physical therapy.  If not improved we will proceed to MRI arthrogram.   PDMP not reviewed this encounter. Orders Placed This Encounter  Procedures   Ambulatory referral to Physical Therapy    Referral Priority:   Routine    Referral Type:   Physical Medicine    Referral Reason:   Specialty Services Required    Requested Specialty:   Physical Therapy    Number of Visits Requested:   1   No orders of the defined types were placed in this encounter.    Discussed warning signs or symptoms. Please see discharge instructions. Patient expresses understanding.   The above documentation has been reviewed and is accurate and complete Clementeen Graham, M.D.

## 2023-11-25 ENCOUNTER — Ambulatory Visit: Admitting: Family Medicine

## 2023-11-25 ENCOUNTER — Other Ambulatory Visit: Payer: Self-pay

## 2023-11-25 VITALS — BP 110/60 | HR 75 | Ht 63.0 in | Wt 162.0 lb

## 2023-11-25 DIAGNOSIS — M25551 Pain in right hip: Secondary | ICD-10-CM | POA: Diagnosis not present

## 2023-11-25 NOTE — Patient Instructions (Signed)
 Thank you for coming in today.   I've referred you to Physical Therapy.  Let us know if you don't hear from them in one week.   Recheck in 6 weeks.   Let me know sooner if this is not working.

## 2023-12-01 NOTE — Therapy (Signed)
 OUTPATIENT PHYSICAL THERAPY EVALUATION   Patient Name: Kathy Rose MRN: [id number] DOB:04-22-1976, 48 y.o., female Today's Date: 12/07/2023   END OF SESSION:  PT End of Session - 12/07/23 1100     Visit Number 1    Number of Visits 9    Date for PT Re-Evaluation 02/01/24    Authorization Type BCBS    PT Start Time 1026    PT Stop Time 1100    PT Time Calculation (min) 34 min    Activity Tolerance Patient tolerated treatment well    Behavior During Therapy [facility] for tasks assessed/performed             History reviewed. No pertinent past medical history. Past Surgical History:  Procedure Laterality Date   TUBAL LIGATION     Patient Active Problem List   Diagnosis Date Noted   Right hip pain 11/18/2023   Family history of colonic polyps 04/12/2023   Chest pain 04/12/2023   Fatigue 04/12/2023   Encounter for general adult medical examination with abnormal findings 04/12/2023   Immunization due 04/12/2023   Clinical infection 07/07/2022   Dysuria 07/07/2022   Cold sore 04/01/2022   Frequent UTI 04/01/2022   Easy bruising 04/01/2022   Vitamin D  deficiency 04/01/2022   Hx of iron deficiency anemia 04/01/2022   PCB (post coital bleeding) 04/01/2022   Frequent menstruation 08/03/2013    PCP: Abram Abraham, NP-C  REFERRING PROVIDER: Syliva Even, MD  REFERRING DIAG: Right hip pain  THERAPY DIAG:  Pain in right hip  Muscle weakness (generalized)  Rationale for Evaluation and Treatment: Rehabilitation  ONSET DATE: Chronic   SUBJECTIVE:  SUBJECTIVE STATEMENT: Patient reports right hip pain has been going on for a while but is not constant. No specific mechanism of injury. Recently within last few weeks it has been more prominent. Pain is mainly when standing or putting pressure on it. Feels internal, in the center or joint of the right hip. She reports this morning it hasn't been bad but did start to feel it more when she arrived today. When it does  bother her she feels like she is limping. She denies any pain with sitting or lying on the right side, and denies any clicking or popping of the hip. There are times where if she is sitting on the couch and she goes to get up it will catch her and she has to catch herself on the couch.   PERTINENT HISTORY: See PMH above  PAIN:  Are you having pain? Yes:  NPRS scale: 1/10 currently, 7/10 at worst Pain location: Right hip Pain description: Sharp, piercing Aggravating factors: Standing, stairs, walking Relieving factors: Medication  PRECAUTIONS: None  RED FLAGS: None   WEIGHT BEARING RESTRICTIONS: No  FALLS:  Has patient fallen in last 6 months? 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 PATIENT SURVEYS:  PSFS: 3.7 Pilates: 9 Walking: 2 Running: 0  COGNITION: Overall cognitive status: Within functional limits for tasks assessed   SENSATION: WFL  MUSCLE LENGTH: Slight hamstring flexibility deficit  POSTURE:   WFL  PALPATION: Non-tender to palpation for concordant pain  LOWER EXTREMITY ROM:   Hip and knee PROM grossly WFL  LOWER EXTREMITY MMT:  MMT Right eval Left eval  Hip flexion 4 4  Hip extension 4 4  Hip abduction 4 4  Hip adduction    Hip internal rotation    Hip external rotation    Knee flexion 5 5  Knee extension 5 5  Ankle dorsiflexion    Ankle plantarflexion    Ankle inversion    Ankle eversion     (Blank rows = not tested)  LOWER EXTREMITY SPECIAL TESTS:  Scour negative FADDIR negative FABER negative  FUNCTIONAL TESTS:  SLS: > 30 sec bilaterally, trendelenburg bilaterally  GAIT: Assistive device utilized: None Level of assistance: Complete Independence Comments: WFL                                                                                                             TREATMENT  [facility] Adult PT Treatment:                                                DATE: 12/07/2023 Bridge with red 10 x 5 sec Clamshell with red x 15 each  PATIENT EDUCATION:  Education details: Exam findings, POC, HEP Person educated: Patient Education method: Explanation, Demonstration, Tactile cues, Verbal cues, and Handouts Education comprehension: verbalized understanding, returned demonstration, verbal cues required, tactile cues required, and needs further education  HOME EXERCISE PROGRAM: Access Code: [email or web address]    ASSESSMENT: CLINICAL IMPRESSION: Patient is a 48 y.o. female who was seen today for physical therapy evaluation and treatment for chronic right hip pain. She reports the pain feeling deep and within the hip, but unable to reproduce any concordant symptoms this visit. She exhibits good range of motion of the hip with hypermobility moving into hip IR. She does exhibit hip strength deficit bilaterally that could be contributing to her hip pain with weight bearing tasks.   OBJECTIVE IMPAIRMENTS: decreased activity tolerance, decreased strength, and pain.   ACTIVITY LIMITATIONS: standing, stairs, and locomotion level  PARTICIPATION LIMITATIONS: meal prep, cleaning, shopping, community activity, and occupation  PERSONAL FACTORS: Fitness, Past/current experiences, and Time since onset of injury/illness/exacerbation are also affecting patient's functional outcome.   REHAB POTENTIAL: Good  CLINICAL DECISION MAKING: Stable/uncomplicated  EVALUATION COMPLEXITY: Low   GOALS: Goals reviewed with patient? Yes  SHORT TERM GOALS: Target date: 01/04/2024  Patient will be I with initial HEP in order to progress with therapy. Baseline: HEP provided at eval Goal status: INITIAL  2.  Patient will report pain at worst with walking </= 4/10 in order to reduce functional limitations Baseline: 7/10 pain at  worst Goal status: INITIAL  LONG TERM GOALS: Target date: 02/01/2024  Patient will be I with final HEP to maintain progress from PT. Baseline: HEP provided at eval Goal status: INITIAL  2.  Patient will report PSFS >/= 7 in order to indicate improvement in her functional status Baseline: 3.7 Goal status: INITIAL  3.  Patient will demonstrate hip strength >/= 4+/5 MMT in order to reduce pain and improve her walking  tole Baseline: 4/5 MMT Goal status: INITIAL  4.  Patient will report pain at worst with walking </= 1/10 in order to reduce functional limitations Baseline: 7/10 at eval Goal status: INITIAL   PLAN: PT FREQUENCY: 1x/week  PT DURATION: 8 weeks  PLANNED INTERVENTIONS: 97164- PT Re-evaluation, 97110-Therapeutic exercises, 97530- Therapeutic activity, 97112- Neuromuscular re-education, 97535- Self Care, 54098- Manual therapy, Patient/Family education, Stair training, Dry Needling, Joint mobilization, Joint manipulation, Spinal manipulation, Spinal mobilization, Cryotherapy, and Moist heat  PLAN FOR NEXT SESSION: Review HEP and progress PRN, continue with hip/core strengthening and progress to closed chain hip strengthening and control   Leah Primus, PT, DPT, LAT, ATC 12/07/23  1:02 PM Phone: [phone] Fax: [phone]

## 2023-12-06 ENCOUNTER — Encounter: Payer: Self-pay | Admitting: Family Medicine

## 2023-12-06 ENCOUNTER — Ambulatory Visit: Admitting: Family Medicine

## 2023-12-06 VITALS — BP 118/68 | HR 67 | Temp 97.3°F | Ht 63.0 in | Wt 162.0 lb

## 2023-12-06 DIAGNOSIS — M25551 Pain in right hip: Secondary | ICD-10-CM

## 2023-12-06 DIAGNOSIS — D649 Anemia, unspecified: Secondary | ICD-10-CM

## 2023-12-06 NOTE — Patient Instructions (Signed)
 I recommend taking an over-the-counter multivitamin as well as iron.  Remember to call and schedule with [facility] for your for screening colonoscopy.  Let me know if you have any new or worsening symptoms.  Try to do more stairs while talking as you do in your job see if this gets easier.

## 2023-12-06 NOTE — Progress Notes (Signed)
 Subjective:     Patient ID: Kathy Rose, female    DOB: July 19, 1976, 47 y.o.   MRN: [id number]  Chief Complaint  Patient presents with   Follow-up    2 weeks f/u, consult specialist and order PT.    HPI  History of Present Illness          She is here to follow-up on right hip pain as well as chronic health conditions. She saw Kathy Agee, NP, for right hip pain and also saw sports medicine.  She starts PT tomorrow.  She is taking meloxicam  as needed.  She has seen urology.  She has not yet scheduled with [facility] for colonoscopy.  Notes shortness of breath while going up stairs and having to talk.  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  She denies shortness of breath walking on flat surfaces.  She denies dizziness, chest pain, palpitations.  No leg pain or swelling.  Mild anemia.  States she is still having her menstrual cycles.    Health Maintenance Due  Topic Date Due   COVID-19 Vaccine (1) Never done   Colonoscopy  Never done    History reviewed. No pertinent past medical history.  Past Surgical History:  Procedure Laterality Date   TUBAL LIGATION      Family History  Problem Relation Age of Onset   Diabetes Maternal Grandmother     Social History   Socioeconomic History   Marital status: Married    Spouse name: Not on file   Number of children: Not on file   Years of education: Not on file   Highest education level: Not on file  Occupational History   Not on file  Tobacco Use   Smoking status: Never   Smokeless tobacco: Never  Substance and Sexual Activity   Alcohol use: No   Drug use: No   Sexual activity: Yes    Partners: Male    Birth control/protection: Surgical    Comment: tubal  Other Topics Concern   Not on file  Social History Narrative   Not on file   Social Drivers of Health   Financial Resource Strain: Not on file  Food Insecurity: Not on file  Transportation Needs: Not on file  Physical Activity: Not on file   Stress: Not on file  Social Connections: Not on file  Intimate Partner Violence: Not on file    Outpatient Medications Prior to Visit  Medication Sig Dispense Refill   brompheniramine-pseudoephedrine-DM 30-2-10 MG/5ML syrup Take 5 mLs by mouth 4 (four) times daily as needed. 240 mL 0   cetirizine (ZYRTEC) 10 MG chewable tablet Chew 10 mg by mouth daily.     ipratropium (ATROVENT ) 0.03 % nasal spray Place 2 sprays into both nostrils every 12 (twelve) hours. 30 mL 12   meloxicam  (MOBIC ) 7.5 MG tablet Take 1 tablet (7.5 mg total) by mouth daily. 30 tablet 0   Multiple Vitamins-Minerals (ZINC PO) Take by mouth.     No facility-administered medications prior to visit.    Allergies  Allergen Reactions   Albuterol      Patient indicates this made her tongue feel "funny"   Claritin [Loratadine]     Review of Systems  Constitutional:  Negative for chills, fever and malaise/fatigue.  Respiratory:  Negative for cough, shortness of breath and wheezing.        Mild shortness of breath only with walking upstairs and having to lead a tour group, talking, at the  same time.  Cardiovascular:  Negative for chest pain, palpitations, claudication and leg swelling.  Gastrointestinal:  Negative for abdominal pain, constipation, diarrhea, nausea and vomiting.  Genitourinary:  Negative for dysuria, frequency and urgency.  Neurological:  Negative for dizziness, loss of consciousness, weakness and headaches.       Objective:    Physical Exam Constitutional:      General: She is not in acute distress.    Appearance: She is not ill-appearing.  Eyes:     Extraocular Movements: Extraocular movements intact.     Conjunctiva/sclera: Conjunctivae normal.  Cardiovascular:     Rate and Rhythm: Normal rate.  Pulmonary:     Effort: Pulmonary effort is normal.  Musculoskeletal:     Cervical back: Normal range of motion and neck supple.     Right lower leg: No edema.     Left lower leg: No edema.   Skin:    General: Skin is warm and dry.  Neurological:     General: No focal deficit present.     Mental Status: She is alert and oriented to person, place, and time.  Psychiatric:        Mood and Affect: Mood normal.        Behavior: Behavior normal.        Thought Content: Thought content normal.      BP 118/68 (BP Location: Left Arm, Patient Position: Sitting)   Pulse 67   Temp (!) 97.3 F (36.3 C) (Temporal)   Ht 5\' 3"  (1.6 m)   Wt 162 lb (73.5 kg)   LMP 11/03/2023   SpO2 100%   BMI 28.70 kg/m  Wt Readings from Last 3 Encounters:  12/06/23 162 lb (73.5 kg)  11/25/23 162 lb (73.5 kg)  11/18/23 159 lb 6 oz (72.3 kg)       Assessment & Plan:   Problem List Items Addressed This Visit     Right hip pain - Primary   Other Visit Diagnoses       Mild anemia          Recent visit with Dr. Alease Rose for right hip pain.  She notes some improvement.  Starts physical therapy for her hip tomorrow. Reviewed labs from her previous visit showing mild anemia. Declines labs today  Anemia most likely related to menses.  Discussed the importance of having her for screening colonoscopy in the setting of anemia.  Recommend taking over-the-counter iron and multivitamin.     I am having Kathy Saltness "Daune Eric" maintain her Multiple Vitamins-Minerals (ZINC PO), cetirizine, brompheniramine-pseudoephedrine-DM, ipratropium, and meloxicam .  No orders of the defined types were placed in this encounter.

## 2023-12-07 ENCOUNTER — Ambulatory Visit (INDEPENDENT_AMBULATORY_CARE_PROVIDER_SITE_OTHER): Admitting: Physical Therapy

## 2023-12-07 ENCOUNTER — Telehealth: Payer: Self-pay | Admitting: Family Medicine

## 2023-12-07 ENCOUNTER — Encounter: Payer: Self-pay | Admitting: Physical Therapy

## 2023-12-07 ENCOUNTER — Other Ambulatory Visit: Payer: Self-pay

## 2023-12-07 DIAGNOSIS — M6281 Muscle weakness (generalized): Secondary | ICD-10-CM | POA: Diagnosis not present

## 2023-12-07 DIAGNOSIS — M25551 Pain in right hip: Secondary | ICD-10-CM | POA: Diagnosis not present

## 2023-12-07 NOTE — Patient Instructions (Signed)
 Access Code: [email or web address] URL: [email or web address] Date: 12/07/2023 Prepared by: Leah Primus  Exercises - Clam with Resistance  - 1 x daily - 3-4 x weekly - 3 sets - 15 reps - Bridge with Resistance  - 1 x daily - 3-4 x weekly - 3 sets - 10 reps - 5 seconds hold

## 2023-12-07 NOTE — Telephone Encounter (Signed)
 Checked in patient for her physical therapy appointment.  She now has an additional Insurance underwriter.   This has been added to the system and a copy of the card is in demographics.  FYI for PT auth?

## 2023-12-21 ENCOUNTER — Encounter: Payer: Self-pay | Admitting: Physical Therapy

## 2023-12-21 ENCOUNTER — Other Ambulatory Visit: Payer: Self-pay

## 2023-12-21 ENCOUNTER — Ambulatory Visit (INDEPENDENT_AMBULATORY_CARE_PROVIDER_SITE_OTHER): Admitting: Physical Therapy

## 2023-12-21 DIAGNOSIS — M6281 Muscle weakness (generalized): Secondary | ICD-10-CM | POA: Diagnosis not present

## 2023-12-21 DIAGNOSIS — M25551 Pain in right hip: Secondary | ICD-10-CM | POA: Diagnosis not present

## 2023-12-21 NOTE — Therapy (Signed)
 OUTPATIENT PHYSICAL THERAPY TREATMENT   Patient Name: Kathy Rose MRN: [id number] DOB:11-Mar-1976, 48 y.o., female Today's Date: 12/21/2023   END OF SESSION:  PT End of Session - 12/21/23 1309     Visit Number 2    Number of Visits 9    Date for PT Re-Evaluation 02/01/24    Authorization Type BCBS    PT Start Time 1305    PT Stop Time 1345    PT Time Calculation (min) 40 min    Activity Tolerance Patient tolerated treatment well    Behavior During Therapy [facility] for tasks assessed/performed              History reviewed. No pertinent past medical history. Past Surgical History:  Procedure Laterality Date   TUBAL LIGATION     Patient Active Problem List   Diagnosis Date Noted   Right hip pain 11/18/2023   Family history of colonic polyps 04/12/2023   Chest pain 04/12/2023   Fatigue 04/12/2023   Encounter for general adult medical examination with abnormal findings 04/12/2023   Immunization due 04/12/2023   Clinical infection 07/07/2022   Dysuria 07/07/2022   Cold sore 04/01/2022   Frequent UTI 04/01/2022   Easy bruising 04/01/2022   Vitamin D  deficiency 04/01/2022   Hx of iron deficiency anemia 04/01/2022   PCB (post coital bleeding) 04/01/2022   Frequent menstruation 08/03/2013    PCP: Abram Abraham, NP-C  REFERRING PROVIDER: Syliva Even, MD  REFERRING DIAG: Right hip pain  THERAPY DIAG:  Pain in right hip  Muscle weakness (generalized)  Rationale for Evaluation and Treatment: Rehabilitation  ONSET DATE: Chronic   SUBJECTIVE:  SUBJECTIVE STATEMENT: Patient reports the right hip may be feeling a little better. She does still get that pain every once in a while but it is less intense.   PERTINENT HISTORY: See PMH above  PAIN:  Are you having pain? Yes:  NPRS scale: 0/10 currently, 3/10 at worst Pain location: Right hip Pain description: Sharp, piercing Aggravating factors: Standing, stairs, walking Relieving factors:  Medication  PRECAUTIONS: None  PATIENT GOALS: Pain relief with standing and walking tasks   OBJECTIVE:  Note: Objective measures were completed at Evaluation unless otherwise noted. PATIENT SURVEYS:  PSFS: 3.7 Pilates: 9 Walking: 2 Running: 0  MUSCLE LENGTH: Slight hamstring flexibility deficit  LOWER EXTREMITY ROM:   Hip and knee PROM grossly WFL  LOWER EXTREMITY MMT:  MMT Right eval Left eval Right 12/21/2023  Hip flexion 4 4   Hip extension 4 4   Hip abduction 4 4 4   Hip adduction     Hip internal rotation     Hip external rotation     Knee flexion 5 5   Knee extension 5 5   Ankle dorsiflexion     Ankle plantarflexion     Ankle inversion     Ankle eversion      (Blank rows = not tested)  LOWER EXTREMITY SPECIAL TESTS:  Scour negative FADDIR negative FABER negative  FUNCTIONAL TESTS:  SLS: > 30 sec bilaterally, trendelenburg bilaterally  GAIT: Assistive device utilized: None Level of assistance: Complete Independence Comments: [facility]  TREATMENT  [facility] Adult PT Treatment:                                                DATE: 12/21/2023 Recumbent bike L5 x 5 min to improve endurance and workload capacity Clamshell with green 3 x 15 with right Bridge x 10 Figure-4 bridge 2 x 8 each Squat to table tap x 10, holding 15# at chest 2 x 10 Lateral band walk with green at knees 2 x 20 down/back Forward 8" runner step-up x 10 each, holding 5# bilaterally x 10 each  PATIENT EDUCATION:  Education details: HEP update Person educated: Patient Education method: Explanation, Demonstration, Tactile cues, Verbal cues, and Handouts Education comprehension: verbalized understanding, returned demonstration, verbal cues required, tactile cues required, and needs further education  HOME EXERCISE PROGRAM: Access Code: [email or web address]    ASSESSMENT: CLINICAL  IMPRESSION: Patient tolerated therapy well with no adverse effects. Therapy focused on progressing her hip strengthening with good tolerance. She did not report any increased pain with therapy but did report muscular fatigue post session and right hip tightness when performing the figure-4 bridge on the left side. Updated her HEP to further progress hip strengthening for home. Patient would benefit from continued skilled PT to progress mobility and strength in order to reduce pain and maximize functional ability.  Eval: Patient is a 48 y.o. female who was seen today for physical therapy evaluation and treatment for chronic right hip pain. She reports the pain feeling deep and within the hip, but unable to reproduce any concordant symptoms this visit. She exhibits good range of motion of the hip with hypermobility moving into hip IR. She does exhibit hip strength deficit bilaterally that could be contributing to her hip pain with weight bearing tasks.   OBJECTIVE IMPAIRMENTS: decreased activity tolerance, decreased strength, and pain.   ACTIVITY LIMITATIONS: standing, stairs, and locomotion level  PARTICIPATION LIMITATIONS: meal prep, cleaning, shopping, community activity, and occupation  PERSONAL FACTORS: Fitness, Past/current experiences, and Time since onset of injury/illness/exacerbation are also affecting patient's functional outcome.    GOALS: Goals reviewed with patient? Yes  SHORT TERM GOALS: Target date: 01/04/2024  Patient will be I with initial HEP in order to progress with therapy. Baseline: HEP provided at eval Goal status: INITIAL  2.  Patient will report pain at worst with walking </= 4/10 in order to reduce functional limitations Baseline: 7/10 pain at worst Goal status: INITIAL  LONG TERM GOALS: Target date: 02/01/2024  Patient will be I with final HEP to maintain progress from PT. Baseline: HEP provided at eval Goal status: INITIAL  2.  Patient will report PSFS >/= 7  in order to indicate improvement in her functional status Baseline: 3.7 Goal status: INITIAL  3.  Patient will demonstrate hip strength >/= 4+/5 MMT in order to reduce pain and improve her walking tole Baseline: 4/5 MMT Goal status: INITIAL  4.  Patient will report pain at worst with walking </= 1/10 in order to reduce functional limitations Baseline: 7/10 at eval Goal status: INITIAL   PLAN: PT FREQUENCY: 1x/week  PT DURATION: 8 weeks  PLANNED INTERVENTIONS: 97164- PT Re-evaluation, 97110-Therapeutic exercises, 97530- Therapeutic activity, 97112- Neuromuscular re-education, 97535- Self Care, 16109- Manual therapy, Patient/Family education, Stair training, Dry Needling, Joint mobilization, Joint manipulation, Spinal manipulation, Spinal mobilization, Cryotherapy, and Moist heat  PLAN FOR NEXT SESSION: Review  HEP and progress PRN, continue with hip/core strengthening and progress to closed chain hip strengthening and control   Leah Primus, PT, DPT, LAT, ATC 12/21/23  1:49 PM Phone: [phone] Fax: [phone]

## 2023-12-28 ENCOUNTER — Ambulatory Visit (INDEPENDENT_AMBULATORY_CARE_PROVIDER_SITE_OTHER): Admitting: Physical Therapy

## 2023-12-28 ENCOUNTER — Encounter: Payer: Self-pay | Admitting: Physical Therapy

## 2023-12-28 ENCOUNTER — Other Ambulatory Visit: Payer: Self-pay

## 2023-12-28 DIAGNOSIS — M25551 Pain in right hip: Secondary | ICD-10-CM | POA: Diagnosis not present

## 2023-12-28 DIAGNOSIS — M6281 Muscle weakness (generalized): Secondary | ICD-10-CM

## 2023-12-28 NOTE — Therapy (Signed)
 OUTPATIENT PHYSICAL THERAPY TREATMENT   Patient Name: Kathy Rose MRN: [id number] DOB:1976/05/08, 48 y.o., female Today's Date: 12/28/2023   END OF SESSION:  PT End of Session - 12/28/23 1111     Visit Number 3    Number of Visits 9    Date for PT Re-Evaluation 02/01/24    Authorization Type BCBS    PT Start Time 1108    PT Stop Time 1148    PT Time Calculation (min) 40 min    Activity Tolerance Patient tolerated treatment well    Behavior During Therapy [facility] for tasks assessed/performed               History reviewed. No pertinent past medical history. Past Surgical History:  Procedure Laterality Date   TUBAL LIGATION     Patient Active Problem List   Diagnosis Date Noted   Right hip pain 11/18/2023   Family history of colonic polyps 04/12/2023   Chest pain 04/12/2023   Fatigue 04/12/2023   Encounter for general adult medical examination with abnormal findings 04/12/2023   Immunization due 04/12/2023   Clinical infection 07/07/2022   Dysuria 07/07/2022   Cold sore 04/01/2022   Frequent UTI 04/01/2022   Easy bruising 04/01/2022   Vitamin D  deficiency 04/01/2022   Hx of iron deficiency anemia 04/01/2022   PCB (post coital bleeding) 04/01/2022   Frequent menstruation 08/03/2013    PCP: Abram Abraham, NP-C  REFERRING PROVIDER: Syliva Even, MD  REFERRING DIAG: Right hip pain  THERAPY DIAG:  Pain in right hip  Muscle weakness (generalized)  Rationale for Evaluation and Treatment: Rehabilitation  ONSET DATE: Chronic   SUBJECTIVE:  SUBJECTIVE STATEMENT: Patient reports she has had zero issues with the right hip, but the left one bothered her this past muscle, almost felt like a pull muscle.   PERTINENT HISTORY: See PMH above  PAIN:  Are you having pain? Yes:  NPRS scale: 0/10 currently, 3/10 at worst Pain location: Right hip Pain description: Sharp, piercing Aggravating factors: Standing, stairs, walking Relieving factors:  Medication  PRECAUTIONS: None  PATIENT GOALS: Pain relief with standing and walking tasks   OBJECTIVE:  Note: Objective measures were completed at Evaluation unless otherwise noted. PATIENT SURVEYS:  PSFS: 3.7 Pilates: 9 Walking: 2 Running: 0  MUSCLE LENGTH: Slight hamstring flexibility deficit  LOWER EXTREMITY ROM:   Hip and knee PROM grossly WFL  LOWER EXTREMITY MMT:  MMT Right eval Left eval Right 12/21/2023  Hip flexion 4 4   Hip extension 4 4   Hip abduction 4 4 4   Hip adduction     Hip internal rotation     Hip external rotation     Knee flexion 5 5   Knee extension 5 5   Ankle dorsiflexion     Ankle plantarflexion     Ankle inversion     Ankle eversion      (Blank rows = not tested)  LOWER EXTREMITY SPECIAL TESTS:  Scour negative FADDIR negative FABER negative  FUNCTIONAL TESTS:  SLS: > 30 sec bilaterally, trendelenburg bilaterally  GAIT: Assistive device utilized: None Level of assistance: Complete Independence Comments: [facility]  TREATMENT  [facility] Adult PT Treatment:                                                DATE: 12/28/2023 Recumbent bike L5 x 5 min to improve endurance and workload capacity Hooklying piriformis stretch push/pull 3 x 15 sec on right Figure-4 bridge 2 x 10 each Clamshell with green 2 x 15 each Lateral band walk with green at ankles 2 x 20 down/back Squat to table tap holding 15# at chest 3 x 10 SLR 2 x 10 each  PATIENT EDUCATION:  Education details: HEP Person educated: Patient Education method: Programmer, multimedia, Demonstration, Tactile cues, Verbal cues Education comprehension: verbalized understanding, returned demonstration, verbal cues required, tactile cues required, and needs further education  HOME EXERCISE PROGRAM: Access Code: [email or web address]    ASSESSMENT: CLINICAL IMPRESSION: Patient tolerated therapy well with no  adverse effects. Therapy continues to focus primarily on progression of hip strengthening with good tolerance. She is reporting improvement in the right hip but did note some left anterior hip discomfort so included some light hip flexor exercise with good tolerance. No increase in hip pain reported with therapy. No changes made to her HEP this visit. Patient would benefit from continued skilled PT to progress mobility and strength in order to reduce pain and maximize functional ability.   Eval: Patient is a 48 y.o. female who was seen today for physical therapy evaluation and treatment for chronic right hip pain. She reports the pain feeling deep and within the hip, but unable to reproduce any concordant symptoms this visit. She exhibits good range of motion of the hip with hypermobility moving into hip IR. She does exhibit hip strength deficit bilaterally that could be contributing to her hip pain with weight bearing tasks.   OBJECTIVE IMPAIRMENTS: decreased activity tolerance, decreased strength, and pain.   ACTIVITY LIMITATIONS: standing, stairs, and locomotion level  PARTICIPATION LIMITATIONS: meal prep, cleaning, shopping, community activity, and occupation  PERSONAL FACTORS: Fitness, Past/current experiences, and Time since onset of injury/illness/exacerbation are also affecting patient's functional outcome.    GOALS: Goals reviewed with patient? Yes  SHORT TERM GOALS: Target date: 01/04/2024  Patient will be I with initial HEP in order to progress with therapy. Baseline: HEP provided at eval Goal status: INITIAL  2.  Patient will report pain at worst with walking </= 4/10 in order to reduce functional limitations Baseline: 7/10 pain at worst Goal status: INITIAL  LONG TERM GOALS: Target date: 02/01/2024  Patient will be I with final HEP to maintain progress from PT. Baseline: HEP provided at eval Goal status: INITIAL  2.  Patient will report PSFS >/= 7 in order to indicate  improvement in her functional status Baseline: 3.7 Goal status: INITIAL  3.  Patient will demonstrate hip strength >/= 4+/5 MMT in order to reduce pain and improve her walking tole Baseline: 4/5 MMT Goal status: INITIAL  4.  Patient will report pain at worst with walking </= 1/10 in order to reduce functional limitations Baseline: 7/10 at eval Goal status: INITIAL   PLAN: PT FREQUENCY: 1x/week  PT DURATION: 8 weeks  PLANNED INTERVENTIONS: 97164- PT Re-evaluation, 97110-Therapeutic exercises, 97530- Therapeutic activity, 97112- Neuromuscular re-education, 97535- Self Care, 40981- Manual therapy, Patient/Family education, Stair training, Dry Needling, Joint mobilization, Joint manipulation, Spinal manipulation, Spinal mobilization, Cryotherapy, and Moist heat  PLAN FOR NEXT SESSION: Review  HEP and progress PRN, continue with hip/core strengthening and progress to closed chain hip strengthening and control   Leah Primus, PT, DPT, LAT, ATC 12/28/23  11:49 AM Phone: [phone] Fax: [phone]

## 2024-01-04 ENCOUNTER — Ambulatory Visit (INDEPENDENT_AMBULATORY_CARE_PROVIDER_SITE_OTHER): Admitting: Physical Therapy

## 2024-01-04 ENCOUNTER — Other Ambulatory Visit: Payer: Self-pay

## 2024-01-04 ENCOUNTER — Encounter: Payer: Self-pay | Admitting: Physical Therapy

## 2024-01-04 DIAGNOSIS — M6281 Muscle weakness (generalized): Secondary | ICD-10-CM | POA: Diagnosis not present

## 2024-01-04 DIAGNOSIS — M25551 Pain in right hip: Secondary | ICD-10-CM

## 2024-01-04 NOTE — Therapy (Signed)
 OUTPATIENT PHYSICAL THERAPY TREATMENT   Patient Name: Kathy Rose MRN: [id number] DOB:04/23/1976, 48 y.o., female Today's Date: 01/04/2024   END OF SESSION:  PT End of Session - 01/04/24 1112     Visit Number 4    Number of Visits 9    Date for PT Re-Evaluation 02/01/24    Authorization Type BCBS    PT Start Time 1108    PT Stop Time 1147    PT Time Calculation (min) 39 min    Activity Tolerance Patient tolerated treatment well    Behavior During Therapy [facility] for tasks assessed/performed                History reviewed. No pertinent past medical history. Past Surgical History:  Procedure Laterality Date   TUBAL LIGATION     Patient Active Problem List   Diagnosis Date Noted   Right hip pain 11/18/2023   Family history of colonic polyps 04/12/2023   Chest pain 04/12/2023   Fatigue 04/12/2023   Encounter for general adult medical examination with abnormal findings 04/12/2023   Immunization due 04/12/2023   Clinical infection 07/07/2022   Dysuria 07/07/2022   Cold sore 04/01/2022   Frequent UTI 04/01/2022   Easy bruising 04/01/2022   Vitamin D  deficiency 04/01/2022   Hx of iron deficiency anemia 04/01/2022   PCB (post coital bleeding) 04/01/2022   Frequent menstruation 08/03/2013    PCP: Abram Abraham, NP-C  REFERRING PROVIDER: Syliva Even, MD  REFERRING DIAG: Right hip pain  THERAPY DIAG:  Pain in right hip  Muscle weakness (generalized)  Rationale for Evaluation and Treatment: Rehabilitation  ONSET DATE: Chronic   SUBJECTIVE:  SUBJECTIVE STATEMENT: Patient reports she is still doing well. She hasn't been able to do much of her exercise this past week. She did notice that [place] the zoo she was able to walk without having to take any medication for the pain which is improvement compared to the last time she went.   PERTINENT HISTORY: See PMH above  PAIN:  Are you having pain? Yes:  NPRS scale: 0/10 currently, 3/10 at  worst Pain location: Right hip Pain description: Sharp, piercing Aggravating factors: Standing, stairs, walking Relieving factors: Medication  PRECAUTIONS: None  PATIENT GOALS: Pain relief with standing and walking tasks   OBJECTIVE:  Note: Objective measures were completed at Evaluation unless otherwise noted. PATIENT SURVEYS:  PSFS: 3.7 Pilates: 9 Walking: 2 Running: 0  MUSCLE LENGTH: Slight hamstring flexibility deficit  LOWER EXTREMITY ROM:   Hip and knee PROM grossly WFL  LOWER EXTREMITY MMT:  MMT Right eval Left eval Right 12/21/2023  Hip flexion 4 4   Hip extension 4 4   Hip abduction 4 4 4   Hip adduction     Hip internal rotation     Hip external rotation     Knee flexion 5 5   Knee extension 5 5   Ankle dorsiflexion     Ankle plantarflexion     Ankle inversion     Ankle eversion      (Blank rows = not tested)  LOWER EXTREMITY SPECIAL TESTS:  Scour negative FADDIR negative FABER negative  FUNCTIONAL TESTS:  SLS: > 30 sec bilaterally, trendelenburg bilaterally  GAIT: Assistive device utilized: None Level of assistance: Complete Independence Comments: [facility]  TREATMENT  [facility] Adult PT Treatment:                                                DATE: 01/04/2024 Recumbent bike L4 x 5 min to improve endurance and workload capacity Hooklying piriformis stretch push/pull 2 x 15 sec on each Figure-4 bridge 2 x 10 each Bridge with blue at knees x 10 Clamshell with blue 2 x 15 each Forward 8" runner step-up 2 x 10 each Lateral band walk with blue below knees 2 x 20 down/back  PATIENT EDUCATION:  Education details: HEP Person educated: Patient Education method: Programmer, multimedia, Demonstration, Tactile cues, Verbal cues Education comprehension: verbalized understanding, returned demonstration, verbal cues required, tactile cues required, and needs further  education  HOME EXERCISE PROGRAM: Access Code: [email or web address]    ASSESSMENT: CLINICAL IMPRESSION: Patient tolerated therapy well with no adverse effects. Therapy continues to focus primarily on progression of hip strengthening with good tolerance. She was able to progress with the banded resistance with her exercises and did not report any pain with therapy. No changes to HEP but she was provided a blue band for added resistance. Patient would benefit from continued skilled PT to progress mobility and strength in order to reduce pain and maximize functional ability.   Eval: Patient is a 48 y.o. female who was seen today for physical therapy evaluation and treatment for chronic right hip pain. She reports the pain feeling deep and within the hip, but unable to reproduce any concordant symptoms this visit. She exhibits good range of motion of the hip with hypermobility moving into hip IR. She does exhibit hip strength deficit bilaterally that could be contributing to her hip pain with weight bearing tasks.   OBJECTIVE IMPAIRMENTS: decreased activity tolerance, decreased strength, and pain.   ACTIVITY LIMITATIONS: standing, stairs, and locomotion level  PARTICIPATION LIMITATIONS: meal prep, cleaning, shopping, community activity, and occupation  PERSONAL FACTORS: Fitness, Past/current experiences, and Time since onset of injury/illness/exacerbation are also affecting patient's functional outcome.    GOALS: Goals reviewed with patient? Yes  SHORT TERM GOALS: Target date: 01/04/2024  Patient will be I with initial HEP in order to progress with therapy. Baseline: HEP provided at eval 01/04/2024: independent Goal status: MET  2.  Patient will report pain at worst with walking </= 4/10 in order to reduce functional limitations Baseline: 7/10 pain at worst 01/04/2024: 3/10 at worst Goal status: MET  LONG TERM GOALS: Target date: 02/01/2024  Patient will be I with final HEP to maintain progress  from PT. Baseline: HEP provided at eval Goal status: INITIAL  2.  Patient will report PSFS >/= 7 in order to indicate improvement in her functional status Baseline: 3.7 Goal status: INITIAL  3.  Patient will demonstrate hip strength >/= 4+/5 MMT in order to reduce pain and improve her walking tole Baseline: 4/5 MMT Goal status: INITIAL  4.  Patient will report pain at worst with walking </= 1/10 in order to reduce functional limitations Baseline: 7/10 at eval Goal status: INITIAL   PLAN: PT FREQUENCY: 1x/week  PT DURATION: 8 weeks  PLANNED INTERVENTIONS: 97164- PT Re-evaluation, 97110-Therapeutic exercises, 97530- Therapeutic activity, 97112- Neuromuscular re-education, 97535- Self Care, 04540- Manual therapy, Patient/Family education, Stair training, Dry Needling, Joint mobilization, Joint manipulation, Spinal manipulation, Spinal mobilization, Cryotherapy, and Moist heat  PLAN FOR NEXT SESSION: Review HEP and progress  PRN, continue with hip/core strengthening and progress to closed chain hip strengthening and control   Leah Primus, PT, DPT, LAT, ATC 01/04/24  11:56 AM Phone: [phone] Fax: [phone]

## 2024-01-06 ENCOUNTER — Ambulatory Visit: Admitting: Family Medicine

## 2024-01-10 ENCOUNTER — Ambulatory Visit (INDEPENDENT_AMBULATORY_CARE_PROVIDER_SITE_OTHER): Admitting: Family Medicine

## 2024-01-10 ENCOUNTER — Encounter: Payer: Self-pay | Admitting: Family Medicine

## 2024-01-10 VITALS — BP 120/82 | HR 54 | Ht 63.0 in | Wt 160.0 lb

## 2024-01-10 DIAGNOSIS — M25551 Pain in right hip: Secondary | ICD-10-CM | POA: Diagnosis not present

## 2024-01-10 NOTE — Patient Instructions (Signed)
 Thank you for coming in today.   Recheck as needed   Ok to continue home exercises and PT.

## 2024-01-10 NOTE — Progress Notes (Signed)
   I, Miquel Amen, CMA acting as a scribe for Garlan Juniper, MD.  Kathy Rose is a 48 y.o. female who presents to [facility] at [facility] today for f/u R hip pain. Pt was last seen by Dr. Alease Hunter on 11/25/23 and was referred to PT, completing 4 visits.   Today, pt reports some improvement of sx with PT. Compliant with HEP. Will have occasional exacerbation of sx but lasting shorter length of time. Not taking meds for pain. Denies new or worsening sx.   Dx imaging: 11/18/23 R hip XR  Pertinent review of systems: No fever or chills  Relevant historical information: History of iron deficiency anemia.   Exam:  BP 120/82   Pulse (!) 54   Ht 5\' 3"  (1.6 m)   Wt 160 lb (72.6 kg)   SpO2 100%   BMI 28.34 kg/m  General: Well Developed, well nourished, and in no acute distress.   MSK: Right hip normal motion normal gait    Lab and Radiology Results  DG HIP UNILAT W OR W/O PELVIS 2-3 VIEWS RIGHT Result Date: 11/18/2023 CLINICAL DATA:  Right hip pain for several months, worsening for 2 days EXAM: DG HIP (WITH OR WITHOUT PELVIS) 2-3V RIGHT COMPARISON:  None Available. FINDINGS: Frontal view of the pelvis as well as frontal and frogleg lateral views of the right hip are obtained. No acute fracture, subluxation, or dislocation. Joint spaces are well preserved. Sacroiliac joints are normal. Soft tissues are unremarkable. IMPRESSION: 1. Unremarkable pelvis and right hip. Electronically Signed   By: Bobbye Burrow M.D.   On: 11/18/2023 14:56   I, Garlan Juniper, personally (independently) visualized and performed the interpretation of the images attached in this note.   Assessment and Plan: 48 y.o. female with chronic right hip pain.  Improving with PT.  Plan to continue PT and home exercise program.  Check back with me as needed.  Next imaging test if needed would be MRI arthrogram.  Happy to reorder PT in the future if needed.   PDMP not reviewed this encounter. No orders of  the defined types were placed in this encounter.  No orders of the defined types were placed in this encounter.    Discussed warning signs or symptoms. Please see discharge instructions. Patient expresses understanding.   The above documentation has been reviewed and is accurate and complete Garlan Juniper, M.D.

## 2024-01-11 ENCOUNTER — Encounter: Payer: Self-pay | Admitting: Physical Therapy

## 2024-01-11 ENCOUNTER — Other Ambulatory Visit: Payer: Self-pay

## 2024-01-11 ENCOUNTER — Ambulatory Visit (INDEPENDENT_AMBULATORY_CARE_PROVIDER_SITE_OTHER): Admitting: Physical Therapy

## 2024-01-11 DIAGNOSIS — M25551 Pain in right hip: Secondary | ICD-10-CM | POA: Diagnosis not present

## 2024-01-11 DIAGNOSIS — M6281 Muscle weakness (generalized): Secondary | ICD-10-CM | POA: Diagnosis not present

## 2024-01-11 NOTE — Therapy (Signed)
 OUTPATIENT PHYSICAL THERAPY TREATMENT  DISCHARGE   Patient Name: Kathy Rose MRN: [id number] DOB:[birth year], 48 y.o., female Today's Date: 01/11/2024   END OF SESSION:  PT End of Session - 01/11/24 1113     Visit Number 5    Number of Visits 9    Date for PT Re-Evaluation 02/01/24    Authorization Type BCBS    PT Start Time 1108    PT Stop Time 1147    PT Time Calculation (min) 39 min    Activity Tolerance Patient tolerated treatment well    Behavior During Therapy [facility] for tasks assessed/performed                 History reviewed. No pertinent past medical history. Past Surgical History:  Procedure Laterality Date   TUBAL LIGATION     Patient Active Problem List   Diagnosis Date Noted   Right hip pain 11/18/2023   Family history of colonic polyps 04/12/2023   Chest pain 04/12/2023   Fatigue 04/12/2023   Encounter for general adult medical examination with abnormal findings 04/12/2023   Immunization due 04/12/2023   Clinical infection 07/07/2022   Dysuria 07/07/2022   Cold sore 04/01/2022   Frequent UTI 04/01/2022   Easy bruising 04/01/2022   Vitamin D  deficiency 04/01/2022   Hx of iron deficiency anemia 04/01/2022   PCB (post coital bleeding) 04/01/2022   Frequent menstruation 08/03/2013    PCP: Abram Abraham, NP-C  REFERRING PROVIDER: Syliva Even, MD  REFERRING DIAG: Right hip pain  THERAPY DIAG:  Pain in right hip  Muscle weakness (generalized)  Rationale for Evaluation and Treatment: Rehabilitation  ONSET DATE: Chronic   SUBJECTIVE:  SUBJECTIVE STATEMENT: Patient reports she saw the doctor yesterday and that went well. She did have a busy weekend and moved her stuff to [place].    PERTINENT HISTORY: See PMH above  PAIN:  Are you having pain? Yes:  NPRS scale: 0/10 currently, 1/10 at worst Pain location: Right hip Pain description: Sharp, piercing Aggravating factors: Standing, stairs, walking Relieving factors:  Medication  PRECAUTIONS: None  PATIENT GOALS: Pain relief with standing and walking tasks   OBJECTIVE:  Note: Objective measures were completed at Evaluation unless otherwise noted. PATIENT SURVEYS:  PSFS: 3.7 Pilates: 9 Walking: 2 Running: 0  01/11/2024: PSFS: 9.3 Pilates: 10 Walking: 9 Running: 9  MUSCLE LENGTH: Slight hamstring flexibility deficit  LOWER EXTREMITY ROM:   Hip and knee PROM grossly [facility]  LOWER EXTREMITY MMT:  MMT Right eval Left eval Right 12/21/2023 Rt / Lt 01/11/2024  Hip flexion 4 4  4+ / 4+  Hip extension 4 4  4+ / 4+  Hip abduction 4 4 4  4+ / 4+  Hip adduction      Hip internal rotation      Hip external rotation      Knee flexion 5 5    Knee extension 5 5    Ankle dorsiflexion      Ankle plantarflexion      Ankle inversion      Ankle eversion       (Blank rows = not tested)  LOWER EXTREMITY SPECIAL TESTS:  Scour negative FADDIR negative FABER negative  FUNCTIONAL TESTS:  SLS: > 30 sec bilaterally, trendelenburg bilaterally  GAIT: Assistive device utilized: None Level of assistance: Complete Independence Comments: [facility]  TREATMENT  [facility] Adult PT Treatment:                                                DATE: 01/11/2024 Recumbent bike L4 x 5 min to improve endurance and workload capacity Hooklying piriformis stretch push/pull 2 x 15 sec on each Bridge with blue at knees 2 x 10 Clamshell with blue 2 x 15 each Squat to table tap holding 15# at chest 2 x 10 Deadlift 30# 2 x 10 Forward 8" runner step-up 2 x 10 each Lateral band walk with blue at mid shin 2 x 20 down/back  PATIENT EDUCATION:  Education details: POC discharge, HEP Person educated: Patient Education method: Explanation Education comprehension: Verbalized understanding  HOME EXERCISE PROGRAM: Access Code: [email or web address]    ASSESSMENT: CLINICAL  IMPRESSION: Patient tolerated therapy well with no adverse effects. She has achieved all her established goals, reporting improvement in her pain, functional ability, and demonstrating improvement in her hip strength. She is independent with her HEP. Patient will be formally discharged from PT at this time.    Eval: Patient is a 48 y.o. female who was seen today for physical therapy evaluation and treatment for chronic right hip pain. She reports the pain feeling deep and within the hip, but unable to reproduce any concordant symptoms this visit. She exhibits good range of motion of the hip with hypermobility moving into hip IR. She does exhibit hip strength deficit bilaterally that could be contributing to her hip pain with weight bearing tasks.   OBJECTIVE IMPAIRMENTS: decreased activity tolerance, decreased strength, and pain.   ACTIVITY LIMITATIONS: standing, stairs, and locomotion level  PARTICIPATION LIMITATIONS: meal prep, cleaning, shopping, community activity, and occupation  PERSONAL FACTORS: Fitness, Past/current experiences, and Time since onset of injury/illness/exacerbation are also affecting patient's functional outcome.    GOALS: Goals reviewed with patient? Yes  SHORT TERM GOALS: Target date: 01/04/2024  Patient will be I with initial HEP in order to progress with therapy. Baseline: HEP provided at eval 01/04/2024: independent Goal status: MET  2.  Patient will report pain at worst with walking </= 4/10 in order to reduce functional limitations Baseline: 7/10 pain at worst 01/04/2024: 3/10 at worst Goal status: MET  LONG TERM GOALS: Target date: 02/01/2024  Patient will be I with final HEP to maintain progress from PT. Baseline: HEP provided at eval 01/11/2024: independent with HEP Goal status: MET  2.  Patient will report PSFS >/= 7 in order to indicate improvement in her functional status Baseline: 3.7 01/11/2024: 9.3 Goal status: MET  3.  Patient will  demonstrate hip strength >/= 4+/5 MMT in order to reduce pain and improve her walking tole Baseline: 4/5 MMT 01/11/2024: 4+/5 MMT Goal status: MET  4.  Patient will report pain at worst with walking </= 1/10 in order to reduce functional limitations Baseline: 7/10 at eval 01/11/2024: 1/10 Goal status: MET   PLAN: PT FREQUENCY: 1x/week  PT DURATION: 8 weeks  PLANNED INTERVENTIONS: 97164- PT Re-evaluation, 97110-Therapeutic exercises, 97530- Therapeutic activity, 97112- Neuromuscular re-education, 97535- Self Care, 16109- Manual therapy, Patient/Family education, Stair training, Dry Needling, Joint mobilization, Joint manipulation, Spinal manipulation, Spinal mobilization, Cryotherapy, and Moist heat  PLAN FOR NEXT SESSION: NA - discharge   Leah Primus, PT, DPT, LAT, ATC 01/11/24  11:57 AM Phone: [phone] Fax: [phone]   PHYSICAL THERAPY DISCHARGE SUMMARY  Visits from Start of Care: 5  Current functional level related to goals / functional outcomes: See above   Remaining deficits: See above   Education / Equipment: See above   Patient agrees to discharge. Patient goals were met. Patient is being discharged due to meeting the stated rehab goals.
